# Patient Record
Sex: Female | Born: 1985 | Race: White | Hispanic: No | Marital: Married | Smoking: Never smoker
Health system: Southern US, Community
[De-identification: ages and names within clinical notes are randomized; demographics above are authoritative.]

## PROBLEM LIST (undated history)

## (undated) DIAGNOSIS — R112 Nausea with vomiting, unspecified: Secondary | ICD-10-CM

## (undated) DIAGNOSIS — Z9889 Other specified postprocedural states: Secondary | ICD-10-CM

## (undated) DIAGNOSIS — J45909 Unspecified asthma, uncomplicated: Secondary | ICD-10-CM

## (undated) HISTORY — PX: WRIST GANGLION EXCISION: SUR520

## (undated) HISTORY — PX: TONSILLECTOMY AND ADENOIDECTOMY: SHX28

---
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
## 2006-02-22 ENCOUNTER — Other Ambulatory Visit: Admission: RE | Admit: 2006-02-22 | Discharge: 2006-02-22 | Payer: Self-pay | Admitting: *Deleted

## 2007-10-17 ENCOUNTER — Other Ambulatory Visit: Admission: RE | Admit: 2007-10-17 | Discharge: 2007-10-17 | Payer: Self-pay | Admitting: Family Medicine

## 2007-11-17 ENCOUNTER — Encounter: Admission: RE | Admit: 2007-11-17 | Discharge: 2007-11-17 | Payer: Self-pay | Admitting: Orthopedic Surgery

## 2008-10-20 ENCOUNTER — Other Ambulatory Visit: Admission: RE | Admit: 2008-10-20 | Discharge: 2008-10-20 | Payer: Self-pay | Admitting: Family Medicine

## 2012-03-01 ENCOUNTER — Other Ambulatory Visit: Payer: Self-pay | Admitting: Obstetrics and Gynecology

## 2012-03-01 NOTE — Progress Notes (Signed)
Error

## 2013-10-28 LAB — OB RESULTS CONSOLE ANTIBODY SCREEN: Antibody Screen: NEGATIVE

## 2013-10-28 LAB — OB RESULTS CONSOLE HEPATITIS B SURFACE ANTIGEN: Hepatitis B Surface Ag: NEGATIVE

## 2013-10-28 LAB — OB RESULTS CONSOLE HIV ANTIBODY (ROUTINE TESTING): HIV: NONREACTIVE

## 2013-10-28 LAB — OB RESULTS CONSOLE ABO/RH: RH Type: POSITIVE

## 2013-10-28 LAB — OB RESULTS CONSOLE RPR: RPR: NONREACTIVE

## 2013-10-28 LAB — OB RESULTS CONSOLE RUBELLA ANTIBODY, IGM: Rubella: IMMUNE

## 2013-11-25 LAB — OB RESULTS CONSOLE GBS: STREP GROUP B AG: NEGATIVE

## 2013-12-02 LAB — OB RESULTS CONSOLE GC/CHLAMYDIA
Chlamydia: NEGATIVE
GC PROBE AMP, GENITAL: NEGATIVE

## 2014-03-11 ENCOUNTER — Inpatient Hospital Stay (HOSPITAL_COMMUNITY): Admission: AD | Admit: 2014-03-11 | Payer: Self-pay | Source: Ambulatory Visit | Admitting: Obstetrics & Gynecology

## 2014-06-06 ENCOUNTER — Inpatient Hospital Stay (HOSPITAL_COMMUNITY)
Admission: AD | Admit: 2014-06-06 | Discharge: 2014-06-07 | Disposition: A | Discharge status: Home / Self Care | Payer: BC Managed Care – PPO | Source: Ambulatory Visit | Attending: Obstetrics and Gynecology | Admitting: Obstetrics and Gynecology

## 2014-06-06 ENCOUNTER — Encounter (HOSPITAL_COMMUNITY): Payer: Self-pay

## 2014-06-06 DIAGNOSIS — O4693 Antepartum hemorrhage, unspecified, third trimester: Secondary | ICD-10-CM | POA: Diagnosis not present

## 2014-06-06 DIAGNOSIS — Z3A35 35 weeks gestation of pregnancy: Secondary | ICD-10-CM | POA: Diagnosis not present

## 2014-06-06 HISTORY — DX: Unspecified asthma, uncomplicated: J45.909

## 2014-06-06 NOTE — MAU Note (Signed)
Passed a quarter size blood clot at 9 pm. No further bleeding or clot after that episode. No leaking.  Baby moving well. At 9:30 pm having back pain, comes and goes.

## 2014-06-07 ENCOUNTER — Encounter (HOSPITAL_COMMUNITY): Payer: Self-pay | Admitting: Family

## 2014-06-07 DIAGNOSIS — O4693 Antepartum hemorrhage, unspecified, third trimester: Secondary | ICD-10-CM

## 2014-06-07 LAB — URINALYSIS, ROUTINE W REFLEX MICROSCOPIC
BILIRUBIN URINE: NEGATIVE
Glucose, UA: NEGATIVE mg/dL
HGB URINE DIPSTICK: NEGATIVE
KETONES UR: NEGATIVE mg/dL
Leukocytes, UA: NEGATIVE
NITRITE: NEGATIVE
PH: 5.5 (ref 5.0–8.0)
Protein, ur: NEGATIVE mg/dL
Urobilinogen, UA: 0.2 mg/dL (ref 0.0–1.0)

## 2014-06-07 NOTE — Discharge Instructions (Signed)
°  Vaginal Bleeding During Pregnancy, Third Trimester °A small amount of bleeding (spotting) from the vagina is relatively common in pregnancy. Various things can cause bleeding or spotting in pregnancy. Sometimes the bleeding is normal and is not a problem. However, bleeding during the third trimester can also be a sign of something serious for the mother and the baby. Be sure to tell your health care provider about any vaginal bleeding right away.  °Some possible causes of vaginal bleeding during the third trimester include:  °· The placenta may be partially or completely covering the opening to the cervix (placenta previa).   °· The placenta may have separated from the uterus (abruption of the placenta).   °· There may be an infection or growth on the cervix.   °· You may be starting labor, called discharging of the mucus plug.   °· The placenta may grow into the muscle layer of the uterus (placenta accreta).   °HOME CARE INSTRUCTIONS  °Watch your condition for any changes. The following actions may help to lessen any discomfort you are feeling:  °· Follow your health care provider's instructions for limiting your activity. If your health care provider orders bed rest, you may need to stay in bed and only get up to use the bathroom. However, your health care provider may allow you to continue light activity. °· If needed, make plans for someone to help with your regular activities and responsibilities while you are on bed rest. °· Keep track of the number of pads you use each day, how often you change pads, and how soaked (saturated) they are. Write this down. °· Do not use tampons. Do not douche. °· Do not have sexual intercourse or orgasms until approved by your health care provider. °· Follow your health care provider's advice about lifting, driving, and physical activities. °· If you pass any tissue from your vagina, save the tissue so you can show it to your health care provider.   °· Only take  over-the-counter or prescription medicines as directed by your health care provider. °· Do not take aspirin because it can make you bleed.   °· Keep all follow-up appointments as directed by your health care provider. °SEEK MEDICAL CARE IF: °· You have any vaginal bleeding during any part of your pregnancy. °· You have cramps or labor pains. °· You have a fever, not controlled by medicine. °SEEK IMMEDIATE MEDICAL CARE IF:  °· You have severe cramps or pain in your back or belly (abdomen). °· You have chills. °· You have a gush of fluid from the vagina. °· You pass large clots or tissue from your vagina. °· Your bleeding increases. °· You feel light-headed or weak. °· You pass out. °· You feel less movement or no movement of the baby.   °MAKE SURE YOU: °· Understand these instructions. °· Will watch your condition. °· Will get help right away if you are not doing well or get worse. °Document Released: 11/04/2002 Document Revised: 08/19/2013 Document Reviewed: 04/21/2013 °ExitCare® Patient Information ©2015 [employer]. This information is not intended to replace advice given to you by your health care provider. Make sure you discuss any questions you have with your health care provider. ° ° °

## 2014-06-07 NOTE — MAU Provider Note (Signed)
  History     CSN: [id number]  Arrival date and time: 06/06/14 2317   First Provider Initiated Contact with Patient 06/07/14 0000      Chief Complaint  Patient presents with  . Back Pain  . Vaginal Bleeding   Back Pain Pertinent negatives include no abdominal pain.  Vaginal Bleeding Associated symptoms include back pain. Pertinent negatives include no abdominal pain.    Pt is a 28 yo G1P0 at 6560w4d wks IUP here with report of a quarter sized clot of blood when wiping tonight x one.  Denies contractions, leaking of fluid, or active bleeding.  No problems during pregnancy.  No sexual intercourse in past 48 hours.    Past Medical History  Diagnosis Date  . Asthma     Past Surgical History  Procedure Laterality Date  . Tonsillectomy and adenoidectomy    . Wrist ganglion excision      left wrist    History reviewed. No pertinent family history.  History  Substance Use Topics  . Smoking status: Never Smoker   . Smokeless tobacco: Never Used  . Alcohol Use: No    Allergies: No Known Allergies  Prescriptions prior to admission  Medication Sig Dispense Refill  . Prenatal Vit-Fe Fumarate-FA (PRENATAL MULTIVITAMIN) TABS tablet Take 1 tablet by mouth daily at 12 noon.        Review of Systems  Gastrointestinal: Negative for abdominal pain.  Genitourinary: Positive for vaginal bleeding.       Clot of blood when wiping  Musculoskeletal: Positive for back pain.  All other systems reviewed and are negative.  Physical Exam   There were no vitals taken for this visit.  Physical Exam  Constitutional: She is oriented to person, place, and time. She appears well-developed and well-nourished.  HENT:  Head: Normocephalic.  Neck: Normal range of motion. Neck supple.  Cardiovascular: Normal rate, regular rhythm and normal heart sounds.   Respiratory: Effort normal and breath sounds normal.  GI: Soft. There is no tenderness.  Genitourinary: No bleeding around the vagina.  Vaginal discharge (mucusy) found.  Musculoskeletal: Normal range of motion.  Neurological: She is alert and oriented to person, place, and time.  Skin: Skin is warm and dry.   FHR 120's, +accels, reactive Toco - irritability  MAU Course  Procedures  0015 Consulted with Dr. Renaldo FiddlerAdkins > reviewed HPI/exam > discharge to home with preterm labor/bleeding precautions Assessment and Plan  28 yo G1P0 at 5460w4d wks IUP Category I FHR Tracing Vaginal Bleeding in Third Trimester - Likely Mucus Plug  Plan: Discharge to home Preterm labor/bleeding precautions Follow-up with OB as scheduled  KARIM, [facility] Center[facility]ALIDAH N 06/07/2014, 12:01 AM

## 2014-06-19 ENCOUNTER — Encounter (HOSPITAL_COMMUNITY): Payer: Self-pay | Admitting: *Deleted

## 2014-06-19 ENCOUNTER — Inpatient Hospital Stay (HOSPITAL_COMMUNITY)
Admission: AD | Admit: 2014-06-19 | Discharge: 2014-06-23 | DRG: 766 | Disposition: A | Discharge status: Home / Self Care | Payer: BC Managed Care – PPO | Source: Ambulatory Visit | Attending: Obstetrics & Gynecology | Admitting: Obstetrics & Gynecology

## 2014-06-19 DIAGNOSIS — O99214 Obesity complicating childbirth: Secondary | ICD-10-CM | POA: Diagnosis present

## 2014-06-19 DIAGNOSIS — Z3A37 37 weeks gestation of pregnancy: Secondary | ICD-10-CM | POA: Diagnosis present

## 2014-06-19 DIAGNOSIS — O4292 Full-term premature rupture of membranes, unspecified as to length of time between rupture and onset of labor: Secondary | ICD-10-CM | POA: Diagnosis present

## 2014-06-19 DIAGNOSIS — Z6836 Body mass index (BMI) 36.0-36.9, adult: Secondary | ICD-10-CM | POA: Diagnosis not present

## 2014-06-19 DIAGNOSIS — O4290 Premature rupture of membranes, unspecified as to length of time between rupture and onset of labor, unspecified weeks of gestation: Secondary | ICD-10-CM | POA: Diagnosis present

## 2014-06-19 DIAGNOSIS — Z98891 History of uterine scar from previous surgery: Secondary | ICD-10-CM

## 2014-06-19 LAB — COMPREHENSIVE METABOLIC PANEL
ALK PHOS: 226 U/L — AB (ref 39–117)
ALT: 24 U/L (ref 0–35)
ANION GAP: 13 (ref 5–15)
AST: 24 U/L (ref 0–37)
Albumin: 2.6 g/dL — ABNORMAL LOW (ref 3.5–5.2)
BILIRUBIN TOTAL: 0.2 mg/dL — AB (ref 0.3–1.2)
BUN: 8 mg/dL (ref 6–23)
CHLORIDE: 100 meq/L (ref 96–112)
CO2: 22 meq/L (ref 19–32)
CREATININE: 0.61 mg/dL (ref 0.50–1.10)
Calcium: 9.3 mg/dL (ref 8.4–10.5)
GLUCOSE: 81 mg/dL (ref 70–99)
POTASSIUM: 4 meq/L (ref 3.7–5.3)
Sodium: 135 mEq/L — ABNORMAL LOW (ref 137–147)
Total Protein: 7.2 g/dL (ref 6.0–8.3)

## 2014-06-19 LAB — TYPE AND SCREEN
ABO/RH(D): A POS
Antibody Screen: NEGATIVE

## 2014-06-19 LAB — ABO/RH: ABO/RH(D): A POS

## 2014-06-19 LAB — CBC
HEMATOCRIT: 32.7 % — AB (ref 36.0–46.0)
HEMOGLOBIN: 11.2 g/dL — AB (ref 12.0–15.0)
MCH: 29 pg (ref 26.0–34.0)
MCHC: 34.3 g/dL (ref 30.0–36.0)
MCV: 84.7 fL (ref 78.0–100.0)
PLATELETS: 257 10*3/uL (ref 150–400)
RBC: 3.86 MIL/uL — AB (ref 3.87–5.11)
RDW: 13.1 % (ref 11.5–15.5)
WBC: 13.6 10*3/uL — AB (ref 4.0–10.5)

## 2014-06-19 LAB — URIC ACID: Uric Acid, Serum: 6.1 mg/dL (ref 2.4–7.0)

## 2014-06-19 LAB — LACTATE DEHYDROGENASE: LDH: 179 U/L (ref 94–250)

## 2014-06-19 LAB — RPR

## 2014-06-19 LAB — POCT FERN TEST: POCT Fern Test: POSITIVE

## 2014-06-19 MED ORDER — LIDOCAINE HCL (PF) 1 % IJ SOLN: 30.0000 mL | INTRAMUSCULAR | Status: DC | PRN

## 2014-06-19 MED ORDER — TERBUTALINE SULFATE 1 MG/ML IJ SOLN: 0.2500 mg | Freq: Once | INTRAMUSCULAR | Status: AC | PRN

## 2014-06-19 MED ORDER — PHENYLEPHRINE 40 MCG/ML (10ML) SYRINGE FOR IV PUSH (FOR BLOOD PRESSURE SUPPORT)
80.0000 ug | PREFILLED_SYRINGE | INTRAVENOUS | Status: DC | PRN
  Filled 2014-06-19: qty 10
  Filled 2014-06-19: qty 2

## 2014-06-19 MED ORDER — LACTATED RINGERS IV SOLN
INTRAVENOUS | Status: DC
  Administered 2014-06-19 – 2014-06-20 (×7): via INTRAVENOUS

## 2014-06-19 MED ORDER — OXYTOCIN 40 UNITS IN LACTATED RINGERS INFUSION - SIMPLE MED: 62.5000 mL/h | INTRAVENOUS | Status: DC

## 2014-06-19 MED ORDER — CITRIC ACID-SODIUM CITRATE 334-500 MG/5ML PO SOLN
30.0000 mL | ORAL | Status: DC | PRN
Start: 2014-06-19 — End: 2014-06-20
  Administered 2014-06-20: 30 mL via ORAL
  Filled 2014-06-19: qty 15

## 2014-06-19 MED ORDER — ACETAMINOPHEN 325 MG PO TABS: 650.0000 mg | ORAL_TABLET | ORAL | Status: DC | PRN

## 2014-06-19 MED ORDER — EPHEDRINE 5 MG/ML INJ
10.0000 mg | INTRAVENOUS | Status: DC | PRN
  Filled 2014-06-19: qty 2

## 2014-06-19 MED ORDER — LACTATED RINGERS IV SOLN
500.0000 mL | INTRAVENOUS | Status: DC | PRN
  Administered 2014-06-20: 500 mL via INTRAVENOUS

## 2014-06-19 MED ORDER — OXYTOCIN BOLUS FROM INFUSION: 500.0000 mL | INTRAVENOUS | Status: DC

## 2014-06-19 MED ORDER — OXYCODONE-ACETAMINOPHEN 5-325 MG PO TABS: 1.0000 | ORAL_TABLET | ORAL | Status: DC | PRN

## 2014-06-19 MED ORDER — ONDANSETRON HCL 4 MG/2ML IJ SOLN: 4.0000 mg | Freq: Four times a day (QID) | INTRAMUSCULAR | Status: DC | PRN

## 2014-06-19 MED ORDER — FLEET ENEMA 7-19 GM/118ML RE ENEM: 1.0000 | ENEMA | RECTAL | Status: DC | PRN

## 2014-06-19 MED ORDER — LACTATED RINGERS IV SOLN
500.0000 mL | Freq: Once | INTRAVENOUS | Status: AC
  Administered 2014-06-20: 10:00:00 via INTRAVENOUS

## 2014-06-19 MED ORDER — DIPHENHYDRAMINE HCL 50 MG/ML IJ SOLN: 12.5000 mg | INTRAMUSCULAR | Status: DC | PRN

## 2014-06-19 MED ORDER — OXYTOCIN 40 UNITS IN LACTATED RINGERS INFUSION - SIMPLE MED
1.0000 m[IU]/min | INTRAVENOUS | Status: DC
  Filled 2014-06-19: qty 1000

## 2014-06-19 MED ORDER — PHENYLEPHRINE 40 MCG/ML (10ML) SYRINGE FOR IV PUSH (FOR BLOOD PRESSURE SUPPORT)
80.0000 ug | PREFILLED_SYRINGE | INTRAVENOUS | Status: DC | PRN
  Filled 2014-06-19: qty 2
  Filled 2014-06-19: qty 10

## 2014-06-19 MED ORDER — FENTANYL 2.5 MCG/ML BUPIVACAINE 1/10 % EPIDURAL INFUSION (WH - ANES)
14.0000 mL/h | INTRAMUSCULAR | Status: DC | PRN
  Administered 2014-06-20 (×2): 14 mL/h via EPIDURAL
  Filled 2014-06-19: qty 125

## 2014-06-19 MED ORDER — OXYCODONE-ACETAMINOPHEN 5-325 MG PO TABS: 2.0000 | ORAL_TABLET | ORAL | Status: DC | PRN

## 2014-06-19 NOTE — MAU Provider Note (Signed)
Betsy CoderJessica Long is a 28 y.o. G1P0 @ 5333w2d gestation who presents to the MAU with leaking of fluid.  BP 152/93  Pulse 75  Temp(Src) 97.9 F (36.6 C) (Oral)  Resp 18  Ht 5' 10.5" (1.791 m)  Wt 291 lb (131.997 kg)  BMI 41.15 kg/m2   Sterile spec exam done, pooling noted vaginal vault. Specimen obtained for evaluation for ferning.  Fern positive.  RN to contact Dr. Langston MaskerMorris with results.

## 2014-06-19 NOTE — H&P (Signed)
Betsy CoderJessica Kuan is a 28 y.o. female presenting for PROM at 1130 this AM.  She has had only mild back pain since then.  +FM and no VB.  Antepartum course has been uncomplicated.    Maternal Medical History:  Reason for admission: Rupture of membranes.   Contractions: Onset was 6-12 hours ago.   Frequency: rare.   Perceived severity is mild.    Fetal activity: Perceived fetal activity is normal.   Last perceived fetal movement was within the past hour.    Prenatal complications: no prenatal complications Prenatal Complications - Diabetes: none.    OB History   Grav Para Term Preterm Abortions TAB SAB Ect Mult Living   1              Past Medical History  Diagnosis Date  . Asthma    Past Surgical History  Procedure Laterality Date  . Tonsillectomy and adenoidectomy    . Wrist ganglion excision      left wrist   Family History: family history is not on file. Social History:  reports that she has never smoked. She has never used smokeless tobacco. She reports that she does not drink alcohol or use illicit drugs.   Prenatal Transfer Tool  Maternal Diabetes: No Genetic Screening: Normal Maternal Ultrasounds/Referrals: Normal Fetal Ultrasounds or other Referrals:  None Maternal Substance Abuse:  No Significant Maternal Medications:  None Significant Maternal Lab Results:  Lab values include: Group B Strep negative Other Comments:  None  ROS  Dilation: 1 Effacement (%): 80 Station: -2 Exam by:: K.Wilson,Rn Blood pressure 144/80, pulse 73, temperature 99 F (37.2 C), temperature source Oral, resp. rate 20, height 6' (1.829 m), weight 270 lb (122.471 kg). Maternal Exam:  Uterine Assessment: Contraction strength is mild.  Contraction frequency is rare.   Abdomen: Patient reports no abdominal tenderness. Fundal height is c/w dates.   Estimated fetal weight is 7#.       Physical Exam  Constitutional: She is oriented to person, place, and time. She appears  well-developed and well-nourished.  GI: Soft. There is no rebound and no guarding.  Neurological: She is alert and oriented to person, place, and time.  Skin: Skin is warm and dry.  Psychiatric: She has a normal mood and affect. Her behavior is normal.    Prenatal labs: ABO, Rh: --/--/A POS (10/23 1706) Antibody: PENDING (10/23 1706) Rubella: Immune (03/03 0000) RPR: Nonreactive (03/03 0000)  HBsAg: Negative (03/03 0000)  HIV: Non-reactive (03/03 0000)  GBS: Negative (03/31 0000)   Assessment/Plan: 27yo G1 at 8461w2d with PROM -Will add pitocin -Anticipate SVD -Epidural if desired   Rashell Shambaugh 06/19/2014, 6:21 PM

## 2014-06-19 NOTE — Progress Notes (Signed)
Discussed with Dr Langston MaskerMorris that GBS on face sheet of prenatals has no date and urine GBS was done 11/25/13 was negative.  Dr Langston MaskerMorris states she wouldn't treat pt for unknown GBS

## 2014-06-19 NOTE — MAU Note (Signed)
Around 1145, noted a small amt of clear watery fluid.   Continues to come.  No bleeding.  No cramping.

## 2014-06-20 ENCOUNTER — Inpatient Hospital Stay (HOSPITAL_COMMUNITY): Payer: BC Managed Care – PPO | Admitting: Anesthesiology

## 2014-06-20 ENCOUNTER — Encounter (HOSPITAL_COMMUNITY): Payer: BC Managed Care – PPO | Admitting: Anesthesiology

## 2014-06-20 ENCOUNTER — Encounter (HOSPITAL_COMMUNITY)
Admission: AD | Disposition: A | Discharge status: Home / Self Care | Payer: Self-pay | Source: Ambulatory Visit | Attending: Obstetrics & Gynecology

## 2014-06-20 ENCOUNTER — Encounter (HOSPITAL_COMMUNITY): Payer: Self-pay | Admitting: *Deleted

## 2014-06-20 DIAGNOSIS — Z98891 History of uterine scar from previous surgery: Secondary | ICD-10-CM

## 2014-06-20 LAB — CBC
HCT: 31 % — ABNORMAL LOW (ref 36.0–46.0)
HEMATOCRIT: 27.8 % — AB (ref 36.0–46.0)
Hemoglobin: 10.5 g/dL — ABNORMAL LOW (ref 12.0–15.0)
Hemoglobin: 9.6 g/dL — ABNORMAL LOW (ref 12.0–15.0)
MCH: 28.6 pg (ref 26.0–34.0)
MCH: 29.3 pg (ref 26.0–34.0)
MCHC: 33.9 g/dL (ref 30.0–36.0)
MCHC: 34.5 g/dL (ref 30.0–36.0)
MCV: 84.5 fL (ref 78.0–100.0)
MCV: 84.8 fL (ref 78.0–100.0)
PLATELETS: 241 10*3/uL (ref 150–400)
Platelets: 211 10*3/uL (ref 150–400)
RBC: 3.67 MIL/uL — ABNORMAL LOW (ref 3.87–5.11)
RBC: 3.28 MIL/uL — ABNORMAL LOW (ref 3.87–5.11)
RDW: 13 % (ref 11.5–15.5)
RDW: 13 % (ref 11.5–15.5)
WBC: 13.6 10*3/uL — ABNORMAL HIGH (ref 4.0–10.5)
WBC: 18.7 10*3/uL — ABNORMAL HIGH (ref 4.0–10.5)

## 2014-06-20 SURGERY — Surgical Case
Anesthesia: Epidural

## 2014-06-20 MED ORDER — ONDANSETRON HCL 4 MG/2ML IJ SOLN
INTRAMUSCULAR | Status: AC
  Filled 2014-06-20: qty 2

## 2014-06-20 MED ORDER — PROMETHAZINE HCL 25 MG/ML IJ SOLN
6.2500 mg | INTRAMUSCULAR | Status: DC | PRN
Start: 2014-06-20 — End: 2014-06-20

## 2014-06-20 MED ORDER — NALBUPHINE HCL 10 MG/ML IJ SOLN: 5.0000 mg | Freq: Once | INTRAMUSCULAR | Status: AC | PRN

## 2014-06-20 MED ORDER — SCOPOLAMINE 1 MG/3DAYS TD PT72
1.0000 | MEDICATED_PATCH | Freq: Once | TRANSDERMAL | Status: DC
  Administered 2014-06-20: 1.5 mg via TRANSDERMAL

## 2014-06-20 MED ORDER — MEPERIDINE HCL 25 MG/ML IJ SOLN: 6.2500 mg | INTRAMUSCULAR | Status: DC | PRN

## 2014-06-20 MED ORDER — TETANUS-DIPHTH-ACELL PERTUSSIS 5-2.5-18.5 LF-MCG/0.5 IM SUSP: 0.5000 mL | Freq: Once | INTRAMUSCULAR | Status: DC

## 2014-06-20 MED ORDER — OXYTOCIN 10 UNIT/ML IJ SOLN
INTRAMUSCULAR | Status: AC
Start: 2014-06-20 — End: 2014-06-20
  Filled 2014-06-20: qty 4

## 2014-06-20 MED ORDER — SIMETHICONE 80 MG PO CHEW: 80.0000 mg | CHEWABLE_TABLET | ORAL | Status: DC | PRN

## 2014-06-20 MED ORDER — MENTHOL 3 MG MT LOZG: 1.0000 | LOZENGE | OROMUCOSAL | Status: DC | PRN

## 2014-06-20 MED ORDER — SODIUM CHLORIDE 0.9 % IJ SOLN: 3.0000 mL | INTRAMUSCULAR | Status: DC | PRN

## 2014-06-20 MED ORDER — CEFAZOLIN SODIUM-DEXTROSE 2-3 GM-% IV SOLR: 2.0000 g | Freq: Three times a day (TID) | INTRAVENOUS | Status: DC

## 2014-06-20 MED ORDER — ONDANSETRON HCL 4 MG PO TABS
4.0000 mg | ORAL_TABLET | ORAL | Status: DC | PRN
Start: 2014-06-20 — End: 2014-06-23

## 2014-06-20 MED ORDER — SIMETHICONE 80 MG PO CHEW
80.0000 mg | CHEWABLE_TABLET | ORAL | Status: DC
  Administered 2014-06-21 – 2014-06-22 (×3): 80 mg via ORAL
  Filled 2014-06-20 (×3): qty 1

## 2014-06-20 MED ORDER — ZOLPIDEM TARTRATE 5 MG PO TABS: 5.0000 mg | ORAL_TABLET | Freq: Every evening | ORAL | Status: DC | PRN

## 2014-06-20 MED ORDER — DIPHENHYDRAMINE HCL 50 MG/ML IJ SOLN: 12.5000 mg | INTRAMUSCULAR | Status: DC | PRN

## 2014-06-20 MED ORDER — IBUPROFEN 600 MG PO TABS: 600.0000 mg | ORAL_TABLET | Freq: Four times a day (QID) | ORAL | Status: DC | PRN

## 2014-06-20 MED ORDER — MORPHINE SULFATE (PF) 0.5 MG/ML IJ SOLN
INTRAMUSCULAR | Status: DC | PRN
  Administered 2014-06-20: 3 mg via EPIDURAL
  Administered 2014-06-20: 2 mg via INTRAVENOUS

## 2014-06-20 MED ORDER — KETOROLAC TROMETHAMINE 30 MG/ML IJ SOLN: 15.0000 mg | Freq: Once | INTRAMUSCULAR | Status: DC | PRN

## 2014-06-20 MED ORDER — SCOPOLAMINE 1 MG/3DAYS TD PT72
MEDICATED_PATCH | TRANSDERMAL | Status: AC
  Filled 2014-06-20: qty 1

## 2014-06-20 MED ORDER — MIDAZOLAM HCL 2 MG/2ML IJ SOLN: 0.5000 mg | Freq: Once | INTRAMUSCULAR | Status: DC | PRN

## 2014-06-20 MED ORDER — IBUPROFEN 600 MG PO TABS
600.0000 mg | ORAL_TABLET | Freq: Four times a day (QID) | ORAL | Status: DC
  Administered 2014-06-21 – 2014-06-23 (×11): 600 mg via ORAL
  Filled 2014-06-20 (×11): qty 1

## 2014-06-20 MED ORDER — SENNOSIDES-DOCUSATE SODIUM 8.6-50 MG PO TABS
2.0000 | ORAL_TABLET | ORAL | Status: DC
  Administered 2014-06-21 – 2014-06-22 (×3): 2 via ORAL
  Filled 2014-06-20 (×3): qty 2

## 2014-06-20 MED ORDER — NALOXONE HCL 1 MG/ML IJ SOLN: 1.0000 ug/kg/h | INTRAVENOUS | Status: DC | PRN

## 2014-06-20 MED ORDER — NALOXONE HCL 0.4 MG/ML IJ SOLN: 0.4000 mg | INTRAMUSCULAR | Status: DC | PRN

## 2014-06-20 MED ORDER — KETOROLAC TROMETHAMINE 30 MG/ML IJ SOLN: 30.0000 mg | Freq: Four times a day (QID) | INTRAMUSCULAR | Status: DC | PRN

## 2014-06-20 MED ORDER — ACETAMINOPHEN 500 MG PO TABS
1000.0000 mg | ORAL_TABLET | Freq: Four times a day (QID) | ORAL | Status: AC
  Administered 2014-06-21 (×2): 1000 mg via ORAL
  Filled 2014-06-20 (×2): qty 2

## 2014-06-20 MED ORDER — FENTANYL CITRATE 0.05 MG/ML IJ SOLN
INTRAMUSCULAR | Status: AC
  Filled 2014-06-20: qty 2

## 2014-06-20 MED ORDER — DIPHENHYDRAMINE HCL 25 MG PO CAPS
25.0000 mg | ORAL_CAPSULE | ORAL | Status: DC | PRN
  Filled 2014-06-20: qty 1

## 2014-06-20 MED ORDER — OXYCODONE-ACETAMINOPHEN 5-325 MG PO TABS
2.0000 | ORAL_TABLET | ORAL | Status: DC | PRN
Start: 2014-06-20 — End: 2014-06-23

## 2014-06-20 MED ORDER — ONDANSETRON HCL 4 MG/2ML IJ SOLN: 4.0000 mg | INTRAMUSCULAR | Status: DC | PRN

## 2014-06-20 MED ORDER — LACTATED RINGERS IV SOLN: INTRAVENOUS | Status: DC

## 2014-06-20 MED ORDER — ONDANSETRON HCL 4 MG/2ML IJ SOLN
INTRAMUSCULAR | Status: DC | PRN
  Administered 2014-06-20: 4 mg via INTRAVENOUS

## 2014-06-20 MED ORDER — OXYTOCIN 40 UNITS IN LACTATED RINGERS INFUSION - SIMPLE MED
1.0000 m[IU]/min | INTRAVENOUS | Status: DC
Start: 2014-06-20 — End: 2014-06-20

## 2014-06-20 MED ORDER — LIDOCAINE HCL (PF) 1 % IJ SOLN
INTRAMUSCULAR | Status: DC | PRN
  Administered 2014-06-20 (×2): 5 mL

## 2014-06-20 MED ORDER — FENTANYL CITRATE 0.05 MG/ML IJ SOLN: 25.0000 ug | INTRAMUSCULAR | Status: DC | PRN

## 2014-06-20 MED ORDER — SODIUM BICARBONATE 8.4 % IV SOLN
INTRAVENOUS | Status: DC | PRN
  Administered 2014-06-20: 5 mL via EPIDURAL
  Administered 2014-06-20: 3 mL via EPIDURAL
  Administered 2014-06-20: 10 mL via EPIDURAL

## 2014-06-20 MED ORDER — KETOROLAC TROMETHAMINE 30 MG/ML IJ SOLN
30.0000 mg | Freq: Four times a day (QID) | INTRAMUSCULAR | Status: DC | PRN
  Administered 2014-06-20: 30 mg via INTRAMUSCULAR

## 2014-06-20 MED ORDER — NALBUPHINE HCL 10 MG/ML IJ SOLN: 5.0000 mg | INTRAMUSCULAR | Status: DC | PRN

## 2014-06-20 MED ORDER — 0.9 % SODIUM CHLORIDE (POUR BTL) OPTIME
TOPICAL | Status: DC | PRN
  Administered 2014-06-20: 500 mL

## 2014-06-20 MED ORDER — LANOLIN HYDROUS EX OINT: 1.0000 "application " | TOPICAL_OINTMENT | CUTANEOUS | Status: DC | PRN

## 2014-06-20 MED ORDER — MORPHINE SULFATE 0.5 MG/ML IJ SOLN
INTRAMUSCULAR | Status: AC
  Filled 2014-06-20: qty 10

## 2014-06-20 MED ORDER — PRENATAL MULTIVITAMIN CH
1.0000 | ORAL_TABLET | Freq: Every day | ORAL | Status: DC
  Administered 2014-06-21 – 2014-06-23 (×3): 1 via ORAL
  Filled 2014-06-20 (×3): qty 1

## 2014-06-20 MED ORDER — WITCH HAZEL-GLYCERIN EX PADS: 1.0000 "application " | MEDICATED_PAD | CUTANEOUS | Status: DC | PRN

## 2014-06-20 MED ORDER — DEXTROSE 5 % IV SOLN
3.0000 g | Freq: Once | INTRAVENOUS | Status: AC
  Administered 2014-06-20: 3 g via INTRAVENOUS
  Filled 2014-06-20: qty 3000

## 2014-06-20 MED ORDER — OXYTOCIN 40 UNITS IN LACTATED RINGERS INFUSION - SIMPLE MED: 62.5000 mL/h | INTRAVENOUS | Status: AC

## 2014-06-20 MED ORDER — OXYTOCIN 10 UNIT/ML IJ SOLN
40.0000 [IU] | INTRAVENOUS | Status: DC | PRN
  Administered 2014-06-20: 40 [IU] via INTRAVENOUS

## 2014-06-20 MED ORDER — DIBUCAINE 1 % RE OINT: 1.0000 "application " | TOPICAL_OINTMENT | RECTAL | Status: DC | PRN

## 2014-06-20 MED ORDER — FENTANYL CITRATE 0.05 MG/ML IJ SOLN
INTRAMUSCULAR | Status: DC | PRN
  Administered 2014-06-20: 100 ug via INTRAVENOUS

## 2014-06-20 MED ORDER — ONDANSETRON HCL 4 MG/2ML IJ SOLN: 4.0000 mg | Freq: Three times a day (TID) | INTRAMUSCULAR | Status: DC | PRN

## 2014-06-20 MED ORDER — OXYCODONE-ACETAMINOPHEN 5-325 MG PO TABS
1.0000 | ORAL_TABLET | ORAL | Status: DC | PRN
  Administered 2014-06-21 – 2014-06-22 (×2): 1 via ORAL
  Filled 2014-06-20 (×2): qty 1

## 2014-06-20 MED ORDER — KETOROLAC TROMETHAMINE 30 MG/ML IJ SOLN
INTRAMUSCULAR | Status: AC
  Filled 2014-06-20: qty 1

## 2014-06-20 MED ORDER — DIPHENHYDRAMINE HCL 25 MG PO CAPS: 25.0000 mg | ORAL_CAPSULE | Freq: Four times a day (QID) | ORAL | Status: DC | PRN

## 2014-06-20 MED ORDER — SIMETHICONE 80 MG PO CHEW
80.0000 mg | CHEWABLE_TABLET | Freq: Three times a day (TID) | ORAL | Status: DC
  Administered 2014-06-21 – 2014-06-23 (×7): 80 mg via ORAL
  Filled 2014-06-20 (×6): qty 1

## 2014-06-20 SURGICAL SUPPLY — 37 items
ADH SKN CLS APL DERMABOND .7 (GAUZE/BANDAGES/DRESSINGS)
APL SKNCLS STERI-STRIP NONHPOA (GAUZE/BANDAGES/DRESSINGS) ×1
BENZOIN TINCTURE PRP APPL 2/3 (GAUZE/BANDAGES/DRESSINGS) ×3 IMPLANT
CLAMP CORD UMBIL (MISCELLANEOUS) IMPLANT
CLOSURE WOUND 1/2 X4 (GAUZE/BANDAGES/DRESSINGS) ×1
CLOTH BEACON ORANGE TIMEOUT ST (SAFETY) ×3 IMPLANT
DERMABOND ADVANCED (GAUZE/BANDAGES/DRESSINGS)
DERMABOND ADVANCED .7 DNX12 (GAUZE/BANDAGES/DRESSINGS) IMPLANT
DRAPE SHEET LG 3/4 BI-LAMINATE (DRAPES) IMPLANT
DRSG OPSITE POSTOP 4X10 (GAUZE/BANDAGES/DRESSINGS) ×3 IMPLANT
DURAPREP 26ML APPLICATOR (WOUND CARE) ×3 IMPLANT
ELECT REM PT RETURN 9FT ADLT (ELECTROSURGICAL) ×3
ELECTRODE REM PT RTRN 9FT ADLT (ELECTROSURGICAL) ×1 IMPLANT
EXTRACTOR VACUUM KIWI (MISCELLANEOUS) ×6 IMPLANT
EXTRACTOR VACUUM M CUP 4 TUBE (SUCTIONS) IMPLANT
EXTRACTOR VACUUM M CUP 4' TUBE (SUCTIONS)
GLOVE BIO SURGEON STRL SZ 6 (GLOVE) ×3 IMPLANT
GLOVE BIOGEL PI IND STRL 6 (GLOVE) ×2 IMPLANT
GLOVE BIOGEL PI INDICATOR 6 (GLOVE) ×4
GOWN STRL REUS W/TWL LRG LVL3 (GOWN DISPOSABLE) ×6 IMPLANT
KIT ABG SYR 3ML LUER SLIP (SYRINGE) ×3 IMPLANT
NEEDLE HYPO 25X5/8 SAFETYGLIDE (NEEDLE) ×3 IMPLANT
NS IRRIG 1000ML POUR BTL (IV SOLUTION) ×3 IMPLANT
PACK C SECTION WH (CUSTOM PROCEDURE TRAY) ×3 IMPLANT
PAD OB MATERNITY 4.3X12.25 (PERSONAL CARE ITEMS) ×3 IMPLANT
STAPLER VISISTAT 35W (STAPLE) IMPLANT
STRIP CLOSURE SKIN 1/2X4 (GAUZE/BANDAGES/DRESSINGS) ×2 IMPLANT
SUT CHROMIC 0 CTX 36 (SUTURE) ×9 IMPLANT
SUT MON AB 2-0 CT1 27 (SUTURE) ×3 IMPLANT
SUT PDS AB 0 CT1 27 (SUTURE) IMPLANT
SUT PLAIN 0 NONE (SUTURE) IMPLANT
SUT PROLENE 2 0 CT 30 (SUTURE) ×3 IMPLANT
SUT VIC AB 0 CT1 36 (SUTURE) ×3 IMPLANT
SUT VIC AB 4-0 KS 27 (SUTURE) ×3 IMPLANT
TOWEL OR 17X24 6PK STRL BLUE (TOWEL DISPOSABLE) ×3 IMPLANT
TRAY FOLEY CATH 14FR (SET/KITS/TRAYS/PACK) IMPLANT
WATER STERILE IRR 1000ML POUR (IV SOLUTION) ×3 IMPLANT

## 2014-06-20 NOTE — Transfer of Care (Signed)
Immediate Anesthesia Transfer of Care Note  Patient: Betsy CoderJessica Koury  Procedure(s) Performed: Procedure(s): CESAREAN SECTION (N/A)  Patient Location: PACU  Anesthesia Type:Epidural  Level of Consciousness: awake and alert   Airway & Oxygen Therapy: Patient Spontanous Breathing  Post-op Assessment: Report given to PACU RN and Post -op Vital signs reviewed and stable  Post vital signs: Reviewed and stable  Complications: No apparent anesthesia complications

## 2014-06-20 NOTE — Anesthesia Postprocedure Evaluation (Signed)
  Anesthesia Post Note  Patient: [name]  Procedure(s) Performed: Procedure(s) (LRB): CESAREAN SECTION (N/A)  Anesthesia type: Epidural  Patient location: PACU  Post pain: Pain level controlled  Post assessment: Post-op Vital signs reviewed  Last Vitals:  Filed Vitals:   06/20/14 1540  BP: 120/103  Pulse: 84  Temp:   Resp:     Post vital signs: Reviewed  Level of consciousness: awake  Complications: No apparent anesthesia complications

## 2014-06-20 NOTE — Op Note (Signed)
Doris CoderJessica Sluder PROCEDURE DATE: 06/19/2014 - 06/20/2014  PREOPERATIVE DIAGNOSIS: Intrauterine pregnancy at  5662w3d weeks gestation, with non-reassuring fetal tracing  POSTOPERATIVE DIAGNOSIS: The same  PROCEDURE:   Primary Low Transverse Cesarean Section  SURGEON:  Dr. Mitchel HonourMegan Xitlalic Maslin  INDICATIONS: Doris CoderJessica Long is a 28 y.o. G1P1001 at 262w3d scheduled for cesarean section secondary to non-reassuring fetal heart tracing.  The risks of cesarean section discussed with the patient included but were not limited to: bleeding which may require transfusion or reoperation; infection which may require antibiotics; injury to bowel, bladder, ureters or other surrounding organs; injury to the fetus; need for additional procedures including hysterectomy in the event of a life-threatening hemorrhage; placental abnormalities wth subsequent pregnancies, incisional problems, thromboembolic phenomenon and other postoperative/anesthesia complications. The patient concurred with the proposed plan, giving informed written consent for the procedure.    ,FINDINGS:  Viable female infant in cephalic presentation, APGARs 7,9:  Weight 8#0  Clear amniotic fluid.  Intact placenta, three vessel cord.  Grossly normal uterus, ovaries and fallopian tubes. .   ANESTHESIA:    Epidural ESTIMATED BLOOD LOSS: 800 ml SPECIMENS: Placenta sent to L&D COMPLICATIONS: None immediate  PROCEDURE IN DETAIL:  The patient received intravenous antibiotics and had sequential compression devices applied to her lower extremities while in the preoperative area.  She was then taken to the operating room where epidural anesthesia was dosed up to surgical level and was found to be adequate. She was then placed in a dorsal supine position with a leftward tilt, and prepped and draped in a sterile manner.  A foley catheter was placed into her bladder and attached to constant gravity.  After an adequate timeout was performed, a Pfannenstiel skin incision was  made with scalpel and carried through to the underlying layer of fascia. The fascia was incised in the midline and this incision was extended bilaterally using the Mayo scissors. Kocher clamps were applied to the superior aspect of the fascial incision and the underlying rectus muscles were dissected off bluntly. A similar process was carried out on the inferior aspect of the facial incision. The rectus muscles were separated in the midline bluntly and the peritoneum was entered bluntly.   A bladder flap was created sharply and developed bluntly.  The bladder blade was placed.  A transverse hysterotomy was made with a scalpel and extended bilaterally bluntly. The bladder blade was then removed. Secondary to tight rectus muscles restricting delivery of the fetal vertex, the rectus muscles were cut to allow room for delivery.  Dr. Debroah LoopArnold was called to assist with delivery and using a Kiwi vacuum, the infant was successfully delivered.  Cord was clamped and cut and infant was handed over to awaiting neonatology team. Uterine massage was then administered and the placenta delivered intact with three-vessel cord. The uterus was cleared of clot and debris.  The hysterotomy was closed with 0 chromic.  A second imbricating suture of 0-chromic was used to reinforce the incision and aid in hemostasis.  The peritoneum and rectus muscles were noted to be hemostatic and were reapproximated using 2-0 monocryl in a running fashion.  The fascia was closed with 0-Vicryl in a running fashion with good restoration of anatomy.  The subcutaneus tissue was copiously irrigated and reapproximated using plain gut.  The skin was closed with 4-0 Vicryl in a subcuticular fashion.  Pt tolerated the procedure will.  All counts were correct x2.  Pt went to the recovery room in stable condition.

## 2014-06-20 NOTE — Progress Notes (Signed)
Betsy CoderJessica Addair is a 28 y.o. G1P0 at 8239w3d by ultrasound admitted for PROM  Subjective: No current c/o.  Feeling mild CTX.    Objective: BP 106/93  Pulse 63  Temp(Src) 97.8 F (36.6 C) (Oral)  Resp 20  Ht 6' (1.829 m)  Wt 270 lb (122.471 kg)  BMI 36.61 kg/m2  SpO2 98%      FHT:  FHR: 140 bpm, variability: moderate,  accelerations:  Present,  decelerations:  Absent UC:   regular, every 2-3 minutes, pit 20 SVE:   Dilation: 2 Effacement (%): 60 Station: -2 Exam by:: dr Langston Maskermorris AROM of forebag  Labs: Lab Results  Component Value Date   WBC 13.6* 06/19/2014   HGB 11.2* 06/19/2014   HCT 32.7* 06/19/2014   MCV 84.7 06/19/2014   PLT 257 06/19/2014    Assessment / Plan: Augmentation of labor, progressing well  Labor: Progressing normally Preeclampsia:  n/a Fetal Wellbeing:  Category I Pain Control:  Labor support without medications I/D:  n/a Anticipated MOD:  NSVD  Marriah Sanderlin 06/20/2014, 8:42 AM

## 2014-06-20 NOTE — Anesthesia Procedure Notes (Signed)
Epidural Patient location during procedure: OB Start time: 06/20/2014 10:05 AM  Staffing Anesthesiologist: Brayton CavesJACKSON, Saturnino Liew Performed by: anesthesiologist   Preanesthetic Checklist Completed: patient identified, site marked, surgical consent, pre-op evaluation, timeout performed, IV checked, risks and benefits discussed and monitors and equipment checked  Epidural Patient position: sitting Prep: site prepped and draped and DuraPrep Patient monitoring: continuous pulse ox and blood pressure Approach: midline Location: L3-L4 Injection technique: LOR air  Needle:  Needle type: Tuohy  Needle gauge: 17 G Needle length: 9 cm and 9 Needle insertion depth: 8 cm Catheter type: closed end flexible Catheter size: 19 Gauge Catheter at skin depth: 13 cm Test dose: negative  Assessment Events: blood not aspirated, injection not painful, no injection resistance, negative IV test and no paresthesia  Additional Notes Patient identified.  Risk benefits discussed including failed block, incomplete pain control, headache, nerve damage, paralysis, blood pressure changes, nausea, vomiting, reactions to medication both toxic or allergic, and postpartum back pain.  Patient expressed understanding and wished to proceed.  All questions were answered.  Sterile technique used throughout procedure and epidural site dressed with sterile barrier dressing. No paresthesia or other complications noted.The patient did not experience any signs of intravascular injection such as tinnitus or metallic taste in mouth nor signs of intrathecal spread such as rapid motor block. Please see nursing notes for vital signs.

## 2014-06-20 NOTE — Progress Notes (Signed)
Patient has been ruptured >24 hours.  She has been 3-4 cm x 4 hours.  MVUs are inadequate however with the majority of CTXs, there is a late decel despite repositioning.  Pitocin has been turned off and tracing returned to category I.  The patient is counseled for primary C/S secondary to NRFHT.  She understands the risk of bleeding, infection, scarring and damage to surrounding structures.  She also understands the implications in future pregnancies.  All questions were answered and the patient wishes to proceed.    Mitchel HonourMegan Benelli Winther, DO

## 2014-06-20 NOTE — Anesthesia Preprocedure Evaluation (Signed)
Anesthesia Evaluation  Patient identified by MRN, date of birth, ID band Patient awake    Reviewed: Allergy & Precautions, H&P , Patient's Chart, lab work & pertinent test results  Airway Mallampati: III TM Distance: >3 FB Neck ROM: full    Dental   Pulmonary asthma ,  breath sounds clear to auscultation        Cardiovascular hypertension, Rhythm:regular Rate:Normal     Neuro/Psych    GI/Hepatic   Endo/Other  Morbid obesity  Renal/GU      Musculoskeletal   Abdominal   Peds  Hematology   Anesthesia Other Findings   Reproductive/Obstetrics (+) Pregnancy                           Anesthesia Physical Anesthesia Plan  ASA: III  Anesthesia Plan: Epidural   Post-op Pain Management:    Induction:   Airway Management Planned:   Additional Equipment:   Intra-op Plan:   Post-operative Plan:   Informed Consent: I have reviewed the patients History and Physical, chart, labs and discussed the procedure including the risks, benefits and alternatives for the proposed anesthesia with the patient or authorized representative who has indicated his/her understanding and acceptance.     Plan Discussed with:   Anesthesia Plan Comments:         Anesthesia Quick Evaluation

## 2014-06-20 NOTE — Progress Notes (Signed)
Lab at bs for cbc for epidural.

## 2014-06-21 LAB — CBC
HCT: 25.2 % — ABNORMAL LOW (ref 36.0–46.0)
Hemoglobin: 8.6 g/dL — ABNORMAL LOW (ref 12.0–15.0)
MCH: 29.2 pg (ref 26.0–34.0)
MCHC: 34.1 g/dL (ref 30.0–36.0)
MCV: 85.4 fL (ref 78.0–100.0)
PLATELETS: 183 10*3/uL (ref 150–400)
RBC: 2.95 MIL/uL — ABNORMAL LOW (ref 3.87–5.11)
RDW: 13.4 % (ref 11.5–15.5)
WBC: 12.6 10*3/uL — ABNORMAL HIGH (ref 4.0–10.5)

## 2014-06-21 MED ORDER — FERROUS SULFATE 325 (65 FE) MG PO TABS
325.0000 mg | ORAL_TABLET | Freq: Two times a day (BID) | ORAL | Status: DC
  Administered 2014-06-21 – 2014-06-23 (×4): 325 mg via ORAL
  Filled 2014-06-21 (×4): qty 1

## 2014-06-21 NOTE — Progress Notes (Signed)
Subjective: Postpartum Day 1: Cesarean Delivery Patient reports tolerating PO and no problems voiding.    Objective: Vital signs in last 24 hours: Temp:  [97.7 F (36.5 C)-98.7 F (37.1 C)] 98.4 F (36.9 C) (10/25 1300) Pulse Rate:  [66-90] 82 (10/25 1300) Resp:  [16-25] 20 (10/25 0801) BP: (119-151)/(62-103) 137/62 mmHg (10/25 1300) SpO2:  [94 %-100 %] 98 % (10/25 1300)  Physical Exam:  General: alert, cooperative and appears stated age 8Lochia: appropriate Uterine Fundus: firm Incision: healing well, no significant drainage, no dehiscence DVT Evaluation: No evidence of DVT seen on physical exam. Negative Homan's sign. No cords or calf tenderness.   Recent Labs  06/20/14 1745 06/21/14 0605  HGB 9.6* 8.6*  HCT 27.8* 25.2*    Assessment/Plan: Status post Cesarean section. Doing well postoperatively.  Continue current care.  [name] 06/21/2014, 1:48 PM

## 2014-06-21 NOTE — Anesthesia Postprocedure Evaluation (Signed)
  Anesthesia Post-op Note  Patient: Doris CoderJessica Sponsel  Procedure(s) Performed: Procedure(s): CESAREAN SECTION (N/A)  Patient Location: Mother/Baby  Anesthesia Type:Epidural  Level of Consciousness: awake and alert   Airway and Oxygen Therapy: Patient Spontanous Breathing  Post-op Pain: mild  Post-op Assessment: Post-op Vital signs reviewed, Patient's Cardiovascular Status Stable, Respiratory Function Stable, No signs of Nausea or vomiting, Pain level controlled, No headache, No residual numbness and No residual motor weakness  Post-op Vital Signs: Reviewed  Last Vitals:  Filed Vitals:   06/21/14 0801  BP: 119/62  Pulse: 72  Temp: 36.9 C  Resp: 20    Complications: No apparent anesthesia complications

## 2014-06-21 NOTE — Lactation Note (Signed)
This note was copied from the chart of [name]. Lactation Consultation Note  Initial visit done.  Breastfeeding consultation services and support given to patient.  Mom states baby is latching with nipple shield and she feels pulls and tugs.  Mom just attempted feeding but baby sleepy and not interested.  Discussed cluster feedings.  Encouraged to watch for feeding cues and call with concerns/assist prn  Patient Name: Doris Betsy CoderJessica Rudnicki WNUUV'OToday's Date: 06/21/2014 Reason for consult: Initial assessment   Maternal Data    Feeding Feeding Type: Breast Fed Length of feed: 20 min  LATCH Score/Interventions                      Lactation Tools Discussed/Used     Consult Status Consult Status: Follow-up Date: 06/22/14 Follow-up type: In-patient    Huston FoleyMOULDEN, Zlatan Hornback S 06/21/2014, 11:58 AM

## 2014-06-22 ENCOUNTER — Encounter (HOSPITAL_COMMUNITY): Payer: Self-pay | Admitting: Obstetrics & Gynecology

## 2014-06-22 NOTE — Lactation Note (Signed)
This note was copied from the chart of Girl Betsy CoderJessica Schaab. Lactation Consultation Note  Patient Name: Girl Betsy CoderJessica Lafuente ZOXWR'UToday's Date: 06/22/2014 Reason for consult: Follow-up assessment;Difficult latch Mom was latching baby without the nipple shield reporting she has not used the nipple shield since yesterday. Baby at this visit was not obtaining good depth, dimpling noted with suckling, baby did not have wide gape with well flanged lips. Worked with positioning with Mom but this did not improve depth with latch. Baby was sleepy and would not sustain a good suckling pattern. LC initiated #20 nipple shield and baby was able to obtain more depth, demonstrated a better suckling pattern, bottom lip still needs adjustment off/on. Mom reports some breast milk in the nipple shield when baby came off the breast. LC encouraged Mom to use nipple shield with latch at this point. Set up DEBP for Mom to start post pumping (at least 4 times/day) to encourage milk production. If she receives any EBM advised to finger, spoon or cup feed this back to baby. Baby I/O is adequate at this time, weight loss at 4% at 44 hours so feel baby has been getting some EBM, however nipple shield does appear to help baby sustain a good suckling pattern. Advised Mom baby should be at the breast 8-12 times in 24 hours. Call for assist as needed with latch.   Maternal Data    Feeding Feeding Type: Breast Fed Length of feed: 20 min  LATCH Score/Interventions Latch: Repeated attempts needed to sustain latch, nipple held in mouth throughout feeding, stimulation needed to elicit sucking reflex. (initiated #20 nipple shield) Intervention(s): Adjust position;Assist with latch;Breast massage;Breast compression  Audible Swallowing: A few with stimulation  Type of Nipple: Flat  Comfort (Breast/Nipple): Soft / non-tender     Hold (Positioning): Assistance needed to correctly position infant at breast and maintain  latch. Intervention(s): Breastfeeding basics reviewed;Support Pillows  LATCH Score: 6  Lactation Tools Discussed/Used Tools: Nipple Dorris CarnesShields;Pump Nipple shield size: 20;24 Breast pump type: Double-Electric Breast Pump Pump Review: Setup, frequency, and cleaning Initiated by:: KG Date initiated:: 06/22/14   Consult Status Consult Status: Follow-up Date: 06/22/14 Follow-up type: In-patient    Alfred LevinsGranger, Arjuna Doeden Ann 06/22/2014, 1:00 PM

## 2014-06-22 NOTE — Progress Notes (Signed)
Subjective: Postpartum Day 2: Cesarean Delivery Patient reports tolerating PO and no problems voiding.    Objective: Vital signs in last 24 hours: Temp:  [97.7 F (36.5 C)-98.4 F (36.9 C)] 98 F (36.7 C) (10/26 0542) Pulse Rate:  [69-82] 69 (10/26 0542) Resp:  [18-20] 20 (10/26 0542) BP: (134-154)/(54-88) 134/57 mmHg (10/26 0542) SpO2:  [98 %-100 %] 100 % (10/25 1805)  Physical Exam:  General: alert and cooperative Lochia: appropriate Uterine Fundus: firm Incision: honeycomb dressing CDI DVT Evaluation: No evidence of DVT seen on physical exam. Negative Homan's sign. No cords or calf tenderness. No significant calf/ankle edema.   Recent Labs  06/20/14 1745 06/21/14 0605  HGB 9.6* 8.6*  HCT 27.8* 25.2*    Assessment/Plan: Status post Cesarean section. Doing well postoperatively.  Continue current care.  Peni Rupard G 06/22/2014, 8:41 AM

## 2014-06-22 NOTE — Lactation Note (Signed)
This note was copied from the chart of Girl Betsy CoderJessica Pearce. Lactation Consultation Note Wanted LC to verify latch. Mom sitting in recliner BF in football hold using NS. Baby has been BF fine and has fallen a sleep. Needs to keep baby cheek to breast. Noted nipple tissue visual. Explained to keep close latch to prevent nipple soreness and baby slipping off breast. Baby was fully dressed and warm. Encouraged STS during feedings, and use stimulation at intervals to encourage adequate feedings. Mom states comfortable during feeding when baby close to breast nursing.  Patient Name: Girl Betsy CoderJessica Porco RUEAV'WToday's Date: 06/22/2014 Reason for consult: Follow-up assessment   Maternal Data    Feeding Feeding Type: Breast Fed Length of feed: 15 min  LATCH Score/Interventions Latch: Repeated attempts needed to sustain latch, nipple held in mouth throughout feeding, stimulation needed to elicit sucking reflex. Intervention(s): Adjust position;Breast massage  Audible Swallowing: A few with stimulation Intervention(s): Hand expression  Type of Nipple: Flat  Comfort (Breast/Nipple): Soft / non-tender     Hold (Positioning): Assistance needed to correctly position infant at breast and maintain latch. Intervention(s): Support Pillows;Position options  LATCH Score: 6  Lactation Tools Discussed/Used Tools: Nipple Dorris CarnesShields;Pump Nipple shield size: 20 Breast pump type: Double-Electric Breast Pump Pump Review: Setup, frequency, and cleaning Initiated by:: KG Date initiated:: 06/22/14   Consult Status Consult Status: Follow-up Date: 06/23/14 Follow-up type: In-patient    Charyl DancerCARVER, Vernor Monnig G 06/22/2014, 4:06 PM

## 2014-06-23 ENCOUNTER — Ambulatory Visit: Payer: Self-pay

## 2014-06-23 MED ORDER — OXYCODONE-ACETAMINOPHEN 7.5-325 MG PO TABS: 1.0000 | ORAL_TABLET | ORAL | Status: DC | PRN

## 2014-06-23 NOTE — Discharge Summary (Signed)
Obstetric Discharge Summary Reason for Admission: rupture of membranes Prenatal Procedures: none Intrapartum Procedures: cesarean: low cervical, transverse Postpartum Procedures: none Complications-Operative and Postpartum: none Hemoglobin  Date Value Ref Range Status  06/21/2014 8.6* 12.0 - 15.0 g/dL Final     HCT  Date Value Ref Range Status  06/21/2014 25.2* 36.0 - 46.0 % Final    Physical Exam:  General: alert Lochia: appropriate Uterine Fundus: firm Incision: healing well DVT Evaluation: No evidence of DVT seen on physical exam.  Discharge Diagnoses: Term Pregnancy-delivered  Discharge Information: Date: 06/23/2014 Activity: pelvic rest Diet: routine Medications: PNV and Percocet Condition: stable Instructions: refer to practice specific booklet Discharge to: home   Newborn Data: Live born female  Birth Weight: 8 lb (3629 g) APGAR: 7, 9  Home with mother.  [name] S 06/23/2014, 8:29 AM

## 2014-06-23 NOTE — Lactation Note (Signed)
This note was copied from the chart of Girl Betsy CoderJessica Batt. Lactation Consultation Note  Patient Name: Girl Betsy CoderJessica Armenteros WUJWJ'XToday's Date: 06/23/2014  Mom reports baby is nursing well using nipple shield, Mom reports observing breast milk in the nipple shield when baby comes off the breast and baby has been more satisfied per parents report. Mom is post pumping receiving 7 ml this am with last pumping. Breasts are feeling heavier per Mom today. Changed nipple shield to size 16 for better fit. Demonstrated to parents how to pre-fill the nipple shield with EBM to help with latch if baby fussy at the breast. Demonstrated to parents how to give baby back EBM Mom has pumped by finger feeding with curved tipped syringe. Mom denies discomfort with baby at the breast and the nipple shield was full when baby came off the breast. Plan at this time is Mom will BF with ques but at least 8-12 times in 24 hours. She will post pump 4 times per day and give baby back any amount of EBM she receives. Engorgement care reviewed if needed. OP f/u with Lactation scheduled for Friday, 06/26/14 at 1:00 pm. Peds f/u Thursday for weight check.    Maternal Data    Feeding Feeding Type: Breast Fed Length of feed: 30 min  LATCH Score/Interventions                      Lactation Tools Discussed/Used Tools: Nipple Shields Nipple shield size: 16   Consult Status      Alfred LevinsGranger, Deyjah Kindel Ann 06/23/2014, 2:35 PM

## 2014-06-26 ENCOUNTER — Ambulatory Visit (HOSPITAL_COMMUNITY): Payer: BC Managed Care – PPO

## 2014-06-26 ENCOUNTER — Ambulatory Visit (HOSPITAL_COMMUNITY)
Admission: RE | Admit: 2014-06-26 | Discharge: 2014-06-26 | Disposition: A | Discharge status: Home / Self Care | Payer: BC Managed Care – PPO | Source: Ambulatory Visit | Attending: Obstetrics & Gynecology | Admitting: Obstetrics & Gynecology

## 2014-06-26 NOTE — Lactation Note (Signed)
Lactation Consult Baby's Name: Jewel Baizella Defrank  Date of Birth: 06/20/2013  Gender: female  Gestational Age: 8536w3d (At Birth)  Birth Weight: 8 lb (3629 g)  Weight at Discharge: Weight: 7 lb 4.6 oz (3305 g) Date of Discharge: 06/23/2014  Filed Weights   06/21/14 0136 06/22/14 0042 06/23/14 0021  Weight: 7 lb 14.8 oz (3595 g) 7 lb 10.8 oz (3480 g) 7 lb 4.6 oz (3305 g)      Mother's reason for visit:  Was using a NS while here is hospitakl Visit Type:  Feeding assessment \ Consult:  Initial Lactation Consultant:  Audry RilesWeeks, Caytlyn Evers D  ________________________________________________________________________    ________________________________________________________________________  Mother's Name: Betsy CoderJessica Amedee Type of delivery:   Breastfeeding Experience:  p1    ________________________________________________________________________  Breastfeeding History (Post Discharge)  Frequency of breastfeeding:  q 2-3 Duration of feeding:  30-45  Supplementation  Breastmilk:  Has not supplemented in last 24 hours  Method:  Syringe,   Pumping  Type of pump:  Symphony Frequency:  1 time/day Volume:  30-45 ml  Infant Intake and Output Assessment  Voids:  8-10 in 24 hrs.  Color:  Clear yellow Stools:  8-10 in 24 hrs.  Color:  Brown and Yellow  ________________________________________________________________________  Maternal Breast Assessment  Breast:  Filling Nipple:  Erect Pain level:  0 Pain interventions:  None  _______________________________________________________________________ Feeding Assessment/Evaluation  Initial feeding assessment:  Infant's oral assessment:  WNL  Positioning:  Football Left breast  LATCH documentation:  Latch:  2 = Grasps breast easily, tongue down, lips flanged, rhythmical sucking.  Audible swallowing:  2 = Spontaneous and intermittent  Type of nipple:  2 = Everted at rest and after stimulation  Comfort (Breast/Nipple):  2 = Soft /  non-tender  Hold (Positioning):  1 = Assistance needed to correctly position infant at breast and maintain latch  LATCH score:  9  Attached assessment:  Deep  Lips flanged:  Yes.    Lips untucked:  No.  Suck assessment:  Nutritive    Pre-feed weight: 3478 g  (7 lb. 10.7 oz.) Post-feed weight:  3522 g (7 lb. 12.2 oz.) Amount transferred: 44 ml Amount supplemented:  0 ml  Additional Feeding Assessment -   Infant's oral assessment:  WNL  Positioning:  Football Right breast  LATCH documentation:  Latch:  2 = Grasps breast easily, tongue down, lips flanged, rhythmical sucking.  Audible swallowing:  1  Type of nipple:  2 = Everted at rest and after stimulation  Comfort (Breast/Nipple):  2 = Soft / non-tender  Hold (Positioning):  1 = Assistance needed to correctly position infant at breast and maintain latch  LATCH score:  8  Attached assessment:  Deep  Lips flanged:  Yes.    Lips untucked:  No.  Suck assessment:  Displays both- nutritive for the first few minutes, then Samson Fredericlla was getting sleepy   Pre-feed weight:  3522 g  (7 lb. 12.2 oz.) Post-feed weight:  3528 g (7 lb. 12.5 oz.) Amount transferred:  6 ml Amount supplemented:  0 ml  Mom here for feeding assist- wants to get baby off NS if possible. [facility] been [facility]. Mom has not been supplementing since her milk came in and baby was gaining weight. Has been using the NS. Assisted with latch. Samson Fredericlla latched easily and nursed for 15 minutes. Mom reports that breast feels softer after nursing. Latched to other breast easily without the NS. Praise given.  Mom has her new Medela pump- reviewed setup and cleaning of pump pieces. No further questions at present. To call prn.    Total amount transferred: 50 ml Total supplement given:  0 ml

## 2014-06-29 ENCOUNTER — Encounter (HOSPITAL_COMMUNITY): Payer: Self-pay | Admitting: Obstetrics & Gynecology

## 2014-11-11 ENCOUNTER — Encounter (HOSPITAL_COMMUNITY): Payer: Self-pay

## 2014-11-13 ENCOUNTER — Ambulatory Visit (HOSPITAL_COMMUNITY)
Admission: RE | Admit: 2014-11-13 | Discharge: 2014-11-13 | Disposition: A | Discharge status: Home / Self Care | Payer: BLUE CROSS/BLUE SHIELD | Source: Ambulatory Visit | Attending: Obstetrics and Gynecology | Admitting: Obstetrics and Gynecology

## 2014-11-13 NOTE — Lactation Note (Addendum)
Lactation Consult  Mother's reason for visit:  Mom concerned about milk supply Visit Type:  Outpatient - Feeding assessment Appointment Notes:  Mom has noticed with pumping she is receiving less breast milk. Baby has become fussy at the breast - pulling on/off during the feeding. Mom reports her usual routine is to pump around 5:00AM receiving 8-10 oz of breast milk. Baby will nurse around [phone] for 15 minutes before Mom goes to work. She pumps 3 times/day at work usually every 3 hours occasionally 4 hours receiving about 2 1/2 - 4 oz total. She was receiving 22-23 oz a day with pumping but this has decreased to 19-20 oz per day. She will BF the baby when she gets home and then another 1-2 times before bed. Baby is at the breast 3-4 times/day. Baby is taking 4 to 4 1/2 oz of EBM via bottle every 2 - 2 1/2 hours at daycare. Mom reports [facility] 12 lb. 2.0 oz. Mom reports baby does have reflux. Spits up after feedings most days but will have some days she does not spit up at all. Baby did spit up small amount after each feeding at this visit with burping. Baby does not take any medication for reflux. Mom reports she is not fussy with gas/reflux.  Consult:  Initial Lactation Consultant:  [name], [name]  ________________________________________________________________________    ________________________________________________________________________  Mother's Name: Doris CoderJessica Long Type of delivery:  C/S Breastfeeding Experience:  P1 Maternal Medical Conditions:  Asthma   ________________________________________________________________________  Breastfeeding History (Post Discharge)  Frequency of breastfeeding:  3-4 times/day Duration of feeding:  10-15 minutes  Supplementation Breastmilk:  Volume 120-135 ml Frequency:  Every 2- 2 1/2 hours Total volume per day:  720-960 ml or more  Method:  Bottle,   Pumping  Type of pump:  Medela pump in  style Frequency:  4 times/day Volume:  240-300 ml 1st pumping of the day, then at work Mom pumps 3 times/day receiving 570-600 ml (19-20 oz) or 2 1/2 - 4 oz with each pumping. Prior to the last few weeks she was obtaining 4 oz each pumping at work.   Infant Intake and Output Assessment  Voids:  8-9 in 24 hrs.  Color:  Clear yellow Stools:  1 in 24 hrs.  Color:  Yellow  ________________________________________________________________________  Maternal Breast Assessment  Breast:  Soft Nipple:  Erect Pain level:  0 Pain interventions:  N/A _______________________________________________________________________ Feeding Assessment/Evaluation  Initial feeding assessment:  Infant's oral assessment:  Variance.  Baby appears to have thickness to the labial frenulum. The lingual frenulum is posterior, tight with finger sweep. Baby however has good tongue mobility.   Positioning:  Cross cradle Right breast  LATCH documentation:  Latch:  2 = Grasps breast easily, tongue down, lips flanged, rhythmical sucking.  Audible swallowing:  2 = Spontaneous and intermittent  Type of nipple:  2 = Everted at rest and after stimulation  Comfort (Breast/Nipple):  2 = Soft / non-tender  Hold (Positioning):  2 = No assistance needed to correctly position infant at breast  LATCH score:  10  Attached assessment:  Deep  Lips flanged:  No.Lower lip stays well flanged the upper lip will tuck slightly  Lips untucked:  No.  Suck assessment:  Nutritive  Tools:  Bottle Instructed on use and cleaning of tool:  Yes.    Pre-feed weight:  5672 g  (12 lb. 8.0 oz.) Post-feed weight:  5722 g (12  lb. 9.8 oz.) Amount transferred:  50 ml  Baby nursed for about 7 minutes. About 5 minutes into nursing baby begins to pull on/off the breast, begins to chew at the breast with suckling, becomes fussy.  Amount supplemented:  0 ml  Additional Feeding Assessment -   Infant's oral assessment:  Variance  Positioning:   Cross cradle Left breast  LATCH documentation:  Latch:  2 = Grasps breast easily, tongue down, lips flanged, rhythmical sucking.  Audible swallowing:  2 = Spontaneous and intermittent  Type of nipple:  2 = Everted at rest and after stimulation  Comfort (Breast/Nipple):  2 = Soft / non-tender  Hold (Positioning):  2 = No assistance needed to correctly position infant at breast  LATCH score: 10  Attached assessment:  Deep  Lips flanged:  No.  Lips untucked:  No.  Suck assessment:  Nutritive  Tools:  Bottle Instructed on use and cleaning of tool:  Yes.    Pre-feed weight:  5722 g  (12 lb. 9.8 oz.) Post-feed weight:  5740 g (12 lb. 10.5 oz.) Amount transferred:  18 ml. Same experience as with the right breast. After about 5 minutes into nursing baby starts to pull on/off the breast, chewing at the breast, fussy. Amount supplemented:  75 ml  EBM via bottle   Total amount pumped post feed:  R 0 ml    L 2 ml  With pumping for 13 minutes using Symphony pump  Total amount transferred:  68 ml Total supplement given:  75 ml  Mom last pumped at 0500 this am receiving 9 1/2 oz of breast milk. Baby last BF at 0630 for 20 minutes. Based on the pre/post weight and Mom's volume with post pumping her milk supply has decreased. Advised Mom the fussiness and pulling/chewing at the breast is due to the slowing of milk flow/volume with baby nursing. The baby is trying to get more milk volume.  Per Mom's report - there is a long space of time at night that her breasts are not emptied as the baby is sleeping thru the night and her last feeding may be as early as 8-9:00pm. Mom reports her breasts are very full with the early am pumping.  All of this has probably contributed to her decrease in milk volume.  Discussed the baby's frenulum with Mom but baby appears to have good mobility and LC not sure this has played a part in Mom's decreased milk supply. Mom not sure of her long term goals with BF either.  Gave Mom some website info on "tongue-tie" to research, but advised if her long term goals are to stop BF after 6 months she may not want to pursue a frenotomy for her baby. She would need further evaluation/diagnosis if this was her plan.  Plan discussed with Mom: Continue to pump 1st thing in the am to remove some volume before nursing baby to minimize reflux with 1st feeding. BF before going to work, but add an additional pumping before going to work after the baby's 1st BF session. Pump at least her 3 times at work. BF baby when she gets home but post pump for 10-15 minutes after this feeding and at least 1-2 more times before bed. Her goal is to be sure her breasts are emptied after no longer than 5 hours at night.  Supplement baby after breastfeeding with 2 - 3 oz of EBM, especially with evening feedings.  Start supplements - Lactation support to help increase milk production.  F/u lactation appointment scheduled for Tuesday, 11/24/14 at 0900 to re-assess milk transfer and milk supply.

## 2014-11-17 ENCOUNTER — Ambulatory Visit (HOSPITAL_COMMUNITY): Payer: Self-pay

## 2014-11-24 ENCOUNTER — Ambulatory Visit (HOSPITAL_COMMUNITY)
Admission: RE | Admit: 2014-11-24 | Discharge: 2014-11-24 | Disposition: A | Discharge status: Home / Self Care | Payer: BLUE CROSS/BLUE SHIELD | Source: Ambulatory Visit | Attending: Obstetrics & Gynecology | Admitting: Obstetrics & Gynecology

## 2014-11-24 NOTE — Lactation Note (Signed)
Lactation Consult  Mother's reason for visit:  Mother's visit today is for weight check and  feeding assessment. Mother has been pumping 6-8 times daily . She states that she has increased supply from 18 ounces to 28-30 ounces. Mother states she is very tired with all the pumping. She states that [name] is beginning to decrease feeding time at the breast. She states she feeds well in the early morning and one night time feeding. All other feedings she latches and pushes away aft only a few minutes, then wanting the bottle only. Mother is using a Dr Manson PasseyBrown premmie nipple. She states that [name] take 4 ounces every 2-3 hours. Mother is unsure of her longterm goal at this point due to the fatigue and business of pumping. She also works 4 full days each week.   Visit Type: feeding assessment Appointment Notes:  Consult:  Follow-Up Lactation Consultant:  Michel BickersKendrick, Clarie Camey McCoy  ________________________________________________________________________  Joan FloresBaby's Name: [name] Date of Birth: [birth year] Pediatrician: Dr Phillips OdorGolding Gender: female [facility] Age: <None[facility]  Weight on March 18 Out patient visit, 12-8 ,      Weight today: 12-15.1, 5872, [name] has had a gain of 7 ounces in 11 days.      Admission Information     Provider Service Admission Date      11/24/2014          ADT Events       Unit Room Bed Service Event    [facility]    [facility]      Weight Information (since admission)     None     Weights    Go to now       11/11/14 - Today         One Day Eight Hours  View All      Time:        ###[phone]         ________________________________________________________________________  Mother's Name: [name] Type of delivery:  C-Section Breastfeeding Experience:  none Maternal Medical Conditions:   Asthma Maternal Medications:  Prenatal vits.  ________________________________________________________________________  Frequency of breastfeeding: every 2-3 hours  Duration of feeding: 3-5 mins  Supplementation  Breastmilk:  Volume 120  Ml every 2-3 hours  Method:  Bottle, Dr Manson PasseyBrown  Pumping  Type of pump:  Medela pump in style Frequency:  6-8 times daily Volume: 3-5 ounces   Infant Intake and Output Assessment  Voids:  6-10 in 24 hrs.  Color:  Clear yellow Stools:  1 in 24 hrs.  Color:  Yellow  ________________________________________________________________________  Maternal Breast Assessment  Breast:  Full Nipple:  Erect Pain level:  0 Pain interventions:  Bra  _______________________________________________________________________ Feeding Assessment/Evaluation:  observed infant latch for 1-2 mins and then began pulling off the breast. Mother placed in back to breast several times and used alternate breast. Infant sustained latch for 8-10 mins. She transferred 58 ml. I fed [name] 3 ounces of EBM with a bottle while mother pumped.    Infant's oral assessment:  WNL  Positioning:  Cradle Right breast  LATCH documentation:  Latch:  2 = Grasps breast easily, tongue down, lips flanged, rhythmical sucking.  Audible swallowing:  2 = Spontaneous and intermittent  Type of nipple:  2 = Everted at rest and after stimulation  Comfort (Breast/Nipple):  1 = Filling, red/small blisters or bruises, mild/mod discomfort  Hold (Positioning):  2 = No assistance needed to correctly position infant at breast  LATCH score:  9  Attached assessment:  Deep  Lips flanged:  Yes.    Lips untucked:  Yes.    Suck assessment:  Nutritive  Pre-feed weight: 5872  g , 12-15.1lb Post-feed weight:  5930 g, 13-1 Amount transferred:   58 ml Amount supplemented: 90   ml    Total amount pumped post feed:  R 45 ml    L 45 ml  Total amount transferred:  58 ml Total supplement given:  90  ml  Advised mother to evaluate her feeding goals Lots of ideas to continue with breastfeeding as desired Discussed development behaviors and eating complimentary  food in one month Also advised mother to accept transition if she chooses to wean Doris Frederic to a bottle only Mother has 500 ounces of EBM in freezer and available Donor milk if needed. Suggested that mother use a level one bottle nipple (advised in pace bottle feeding) and to offer infant up to 5 ounces with each feeding if not spitting . Mother plans to follow up with Peds for weight check in 2 weeks  She has a 6 month visit scheduled on May 2. with Dr Phillips Odor

## 2015-10-21 ENCOUNTER — Other Ambulatory Visit (HOSPITAL_COMMUNITY): Payer: Self-pay | Admitting: Family Medicine

## 2015-10-21 DIAGNOSIS — R109 Unspecified abdominal pain: Secondary | ICD-10-CM

## 2015-10-26 ENCOUNTER — Encounter (HOSPITAL_COMMUNITY)
Admission: RE | Admit: 2015-10-26 | Discharge: 2015-10-26 | Disposition: A | Discharge status: Home / Self Care | Payer: BC Managed Care – PPO | Source: Ambulatory Visit | Attending: Family Medicine | Admitting: Family Medicine

## 2015-10-26 DIAGNOSIS — R109 Unspecified abdominal pain: Secondary | ICD-10-CM | POA: Insufficient documentation

## 2015-10-26 MED ORDER — SINCALIDE 5 MCG IJ SOLR
INTRAMUSCULAR | Status: AC
  Administered 2015-10-26: 2.4 ug via INTRAVENOUS
  Filled 2015-10-26: qty 5

## 2015-10-26 MED ORDER — SODIUM CHLORIDE 0.9% FLUSH
INTRAVENOUS | Status: AC
  Filled 2015-10-26: qty 40

## 2015-10-26 MED ORDER — TECHNETIUM TC 99M MEBROFENIN IV KIT
5.0000 | PACK | Freq: Once | INTRAVENOUS | Status: AC | PRN
  Administered 2015-10-26: 5.3 via INTRAVENOUS

## 2015-10-26 MED ORDER — TECHNETIUM TC 99M DISOFENIN: 5.0000 | Freq: Once | Status: DC | PRN

## 2015-10-26 MED ORDER — STERILE WATER FOR INJECTION IJ SOLN
INTRAMUSCULAR | Status: AC
  Administered 2015-10-26: 2.4 mL via INTRAVENOUS
  Filled 2015-10-26: qty 10

## 2016-01-14 LAB — OB RESULTS CONSOLE ANTIBODY SCREEN: Antibody Screen: NEGATIVE

## 2016-01-14 LAB — OB RESULTS CONSOLE RUBELLA ANTIBODY, IGM: Rubella: IMMUNE

## 2016-01-14 LAB — OB RESULTS CONSOLE HEPATITIS B SURFACE ANTIGEN: HEP B S AG: NEGATIVE

## 2016-01-14 LAB — OB RESULTS CONSOLE ABO/RH: RH TYPE: POSITIVE

## 2016-01-14 LAB — OB RESULTS CONSOLE GC/CHLAMYDIA
Chlamydia: NEGATIVE
Gonorrhea: NEGATIVE

## 2016-01-14 LAB — OB RESULTS CONSOLE RPR: RPR: NONREACTIVE

## 2016-01-14 LAB — OB RESULTS CONSOLE HIV ANTIBODY (ROUTINE TESTING): HIV: NONREACTIVE

## 2016-07-12 LAB — OB RESULTS CONSOLE GBS: STREP GROUP B AG: POSITIVE

## 2016-07-18 ENCOUNTER — Encounter (HOSPITAL_COMMUNITY): Payer: Self-pay | Admitting: *Deleted

## 2016-07-18 ENCOUNTER — Telehealth (HOSPITAL_COMMUNITY): Payer: Self-pay | Admitting: *Deleted

## 2016-07-18 NOTE — Telephone Encounter (Signed)
Preadmission screen  

## 2016-07-19 ENCOUNTER — Encounter (HOSPITAL_COMMUNITY): Payer: Self-pay

## 2016-07-20 NOTE — H&P (Signed)
[name] is a 30 y.o. female presenting for repeat C/S, declines TOLAC. The patient's BPs have been elevated x 2 weeks; all mild range and not requiring medication.  She has had no s/sx of pre-eclampsia otherwise.  Antepartum course otherwise uncomplicated.  GBS positive.  OB History    Gravida Para Term Preterm AB Living   2 1 1     1    SAB TAB Ectopic Multiple Live Births           1     Past Medical History:  Diagnosis Date  . Asthma    Past Surgical History:  Procedure Laterality Date  . CESAREAN SECTION N/A 06/20/2014   Procedure: CESAREAN SECTION;  Surgeon: Mitchel HonourMegan Nysia Dell, DO;  Location: [facility];  Service: Obstetrics;  Laterality: N/A;  . TONSILLECTOMY AND ADENOIDECTOMY    . WRIST GANGLION EXCISION     left wrist   Family History: family history includes Cancer in her maternal aunt and mother; Thyroid disease in her maternal aunt and paternal aunt. Social History:  reports that she has never smoked. She has never used smokeless tobacco. She reports that she does not drink alcohol or use drugs.     Maternal Diabetes: No Genetic Screening: Normal Maternal Ultrasounds/Referrals: Normal Fetal Ultrasounds or other Referrals:  None Maternal Substance Abuse:  No Significant Maternal Medications:  None Significant Maternal Lab Results:  Lab values include: Group B Strep positive Other Comments:  None  ROS Maternal Medical History:  Fetal activity: Perceived fetal activity is normal.    Prenatal complications: no prenatal complications Prenatal Complications - Diabetes: none.      Last menstrual period 11/01/2015, unknown if currently breastfeeding. Maternal Exam:  Abdomen: Patient reports no abdominal tenderness. Surgical scars: low transverse.   Fundal height is S>D.   Estimated fetal weight is 8#12.       Physical Exam  Constitutional: She is oriented to person, place, and time. She appears well-developed and well-nourished.  GI: Soft. There is no rebound  and no guarding.  Neurological: She is alert and oriented to person, place, and time.  Skin: Skin is warm and dry.  Psychiatric: She has a normal mood and affect. Her behavior is normal.    Prenatal labs: ABO, Rh: A/Positive/-- (05/19 0000) Antibody: Negative (05/19 0000) Rubella: Immune (05/19 0000) RPR: Nonreactive (05/19 0000)  HBsAg: Negative (05/19 0000)  HIV: Non-reactive (05/19 0000)  GBS: Positive (11/15 0000)   Assessment/Plan: 29yo G2P1001 at 38 weeks with GHTN and for repeat C/S -Patient is counseled re: risk of bleeding, infection, scarring and damage to surrounding structures.  All questions were answered and the patient wishes to proceed.  [name] 07/20/2016, 4:28 PM

## 2016-07-24 ENCOUNTER — Encounter (HOSPITAL_COMMUNITY)
Admission: RE | Admit: 2016-07-24 | Discharge: 2016-07-24 | Disposition: A | Discharge status: Home / Self Care | Payer: BC Managed Care – PPO | Source: Ambulatory Visit | Attending: Obstetrics & Gynecology | Admitting: Obstetrics & Gynecology

## 2016-07-24 LAB — CBC
HEMATOCRIT: 33.3 % — AB (ref 36.0–46.0)
HEMOGLOBIN: 11.2 g/dL — AB (ref 12.0–15.0)
MCH: 27.1 pg (ref 26.0–34.0)
MCHC: 33.6 g/dL (ref 30.0–36.0)
MCV: 80.4 fL (ref 78.0–100.0)
Platelets: 295 10*3/uL (ref 150–400)
RBC: 4.14 MIL/uL (ref 3.87–5.11)
RDW: 13.4 % (ref 11.5–15.5)
WBC: 11.7 10*3/uL — AB (ref 4.0–10.5)

## 2016-07-24 LAB — BASIC METABOLIC PANEL
ANION GAP: 9 (ref 5–15)
BUN: 6 mg/dL (ref 6–20)
CHLORIDE: 102 mmol/L (ref 101–111)
CO2: 22 mmol/L (ref 22–32)
Calcium: 9.3 mg/dL (ref 8.9–10.3)
Creatinine, Ser: 0.52 mg/dL (ref 0.44–1.00)
Glucose, Bld: 89 mg/dL (ref 65–99)
POTASSIUM: 3.8 mmol/L (ref 3.5–5.1)
SODIUM: 133 mmol/L — AB (ref 135–145)

## 2016-07-24 NOTE — Patient Instructions (Signed)
21 [name]  07/24/2016   Your procedure is scheduled on:  07/26/2016  Enter through the [facility] at 0600 AM.  Pick up the phone at the desk and dial [phone].   Call this number if you have problems the morning of surgery: [phone]   Remember:   Do not eat food:After Midnight.  Do not drink clear liquids: After Midnight.  Take these medicines the morning of surgery with A SIP OF WATER: NONE   Do not wear jewelry, make-up or nail polish.  Do not wear lotions, powders, or perfumes. Do not wear deodorant.  Do not shave 48 hours prior to surgery.  Do not bring valuables to the hospital.  [facility] is not   responsible for any belongings or valuables brought to the hospital.  Contacts, dentures or bridgework may not be worn into surgery.  Leave suitcase in the car. After surgery it may be brought to your room.  For patients admitted to the hospital, checkout time is 11:00 AM the day of              discharge.   Patients discharged the day of surgery will not be allowed to drive             home.  Name and phone number of your driver: NA  Special Instructions:   N/A   Please read over the following fact sheets that you were given:   Surgical Site Infection Prevention

## 2016-07-25 ENCOUNTER — Encounter (HOSPITAL_COMMUNITY)
Admission: RE | Admit: 2016-07-25 | Discharge: 2016-07-25 | Disposition: A | Discharge status: Home / Self Care | Payer: BC Managed Care – PPO | Source: Ambulatory Visit | Attending: Obstetrics & Gynecology | Admitting: Obstetrics & Gynecology

## 2016-07-25 LAB — RPR: RPR: NONREACTIVE

## 2016-07-25 MED ORDER — DEXTROSE 5 % IV SOLN
3.0000 g | INTRAVENOUS | Status: AC
  Administered 2016-07-26: 3 g via INTRAVENOUS
  Filled 2016-07-25: qty 3000

## 2016-07-26 ENCOUNTER — Encounter (HOSPITAL_COMMUNITY)
Admission: RE | Disposition: A | Discharge status: Home / Self Care | Payer: Self-pay | Source: Ambulatory Visit | Attending: Obstetrics & Gynecology

## 2016-07-26 ENCOUNTER — Inpatient Hospital Stay (HOSPITAL_COMMUNITY): Payer: BC Managed Care – PPO | Admitting: Anesthesiology

## 2016-07-26 ENCOUNTER — Inpatient Hospital Stay (HOSPITAL_COMMUNITY)
Admission: RE | Admit: 2016-07-26 | Discharge: 2016-07-28 | DRG: 765 | Disposition: A | Discharge status: Home / Self Care | Payer: BC Managed Care – PPO | Source: Ambulatory Visit | Attending: Obstetrics & Gynecology | Admitting: Obstetrics & Gynecology

## 2016-07-26 ENCOUNTER — Encounter (HOSPITAL_COMMUNITY): Payer: Self-pay | Admitting: Emergency Medicine

## 2016-07-26 DIAGNOSIS — Z3A38 38 weeks gestation of pregnancy: Secondary | ICD-10-CM

## 2016-07-26 DIAGNOSIS — O99214 Obesity complicating childbirth: Secondary | ICD-10-CM | POA: Diagnosis present

## 2016-07-26 DIAGNOSIS — O34211 Maternal care for low transverse scar from previous cesarean delivery: Secondary | ICD-10-CM | POA: Diagnosis present

## 2016-07-26 DIAGNOSIS — O99824 Streptococcus B carrier state complicating childbirth: Secondary | ICD-10-CM | POA: Diagnosis present

## 2016-07-26 DIAGNOSIS — J45909 Unspecified asthma, uncomplicated: Secondary | ICD-10-CM | POA: Diagnosis present

## 2016-07-26 DIAGNOSIS — Z6841 Body Mass Index (BMI) 40.0 and over, adult: Secondary | ICD-10-CM | POA: Diagnosis not present

## 2016-07-26 DIAGNOSIS — O9952 Diseases of the respiratory system complicating childbirth: Secondary | ICD-10-CM | POA: Diagnosis present

## 2016-07-26 DIAGNOSIS — Z98891 History of uterine scar from previous surgery: Secondary | ICD-10-CM

## 2016-07-26 DIAGNOSIS — O134 Gestational [pregnancy-induced] hypertension without significant proteinuria, complicating childbirth: Secondary | ICD-10-CM | POA: Diagnosis present

## 2016-07-26 HISTORY — DX: Nausea with vomiting, unspecified: R11.2

## 2016-07-26 HISTORY — DX: Other specified postprocedural states: Z98.890

## 2016-07-26 LAB — PREPARE RBC (CROSSMATCH)

## 2016-07-26 SURGERY — Surgical Case
Anesthesia: Spinal

## 2016-07-26 MED ORDER — NALOXONE HCL 2 MG/2ML IJ SOSY
1.0000 ug/kg/h | PREFILLED_SYRINGE | INTRAVENOUS | Status: DC | PRN
  Filled 2016-07-26 (×19): qty 2

## 2016-07-26 MED ORDER — PHENYLEPHRINE 8 MG IN D5W 100 ML (0.08MG/ML) PREMIX OPTIME
INJECTION | INTRAVENOUS | Status: DC | PRN
  Administered 2016-07-26: 60 ug/min via INTRAVENOUS

## 2016-07-26 MED ORDER — SENNOSIDES-DOCUSATE SODIUM 8.6-50 MG PO TABS
2.0000 | ORAL_TABLET | ORAL | Status: DC
  Administered 2016-07-26 – 2016-07-28 (×2): 2 via ORAL
  Filled 2016-07-26 (×2): qty 2

## 2016-07-26 MED ORDER — OXYCODONE-ACETAMINOPHEN 5-325 MG PO TABS
2.0000 | ORAL_TABLET | ORAL | Status: DC | PRN
  Administered 2016-07-27: 2 via ORAL
  Filled 2016-07-26: qty 2

## 2016-07-26 MED ORDER — OXYTOCIN 40 UNITS IN LACTATED RINGERS INFUSION - SIMPLE MED: 2.5000 [IU]/h | INTRAVENOUS | Status: AC

## 2016-07-26 MED ORDER — LACTATED RINGERS IV SOLN
INTRAVENOUS | Status: DC
  Administered 2016-07-26: 125 mL/h via INTRAVENOUS
  Administered 2016-07-26: 08:00:00 via INTRAVENOUS

## 2016-07-26 MED ORDER — DIBUCAINE 1 % RE OINT: 1.0000 "application " | TOPICAL_OINTMENT | RECTAL | Status: DC | PRN

## 2016-07-26 MED ORDER — OXYTOCIN 10 UNIT/ML IJ SOLN
INTRAVENOUS | Status: DC | PRN
  Administered 2016-07-26: 40 [IU] via INTRAVENOUS

## 2016-07-26 MED ORDER — NALBUPHINE HCL 10 MG/ML IJ SOLN: 5.0000 mg | Freq: Once | INTRAMUSCULAR | Status: DC | PRN

## 2016-07-26 MED ORDER — DEXAMETHASONE SODIUM PHOSPHATE 4 MG/ML IJ SOLN
INTRAMUSCULAR | Status: DC | PRN
  Administered 2016-07-26: 4 mg via INTRAVENOUS

## 2016-07-26 MED ORDER — BUPIVACAINE IN DEXTROSE 0.75-8.25 % IT SOLN
INTRATHECAL | Status: DC | PRN
  Administered 2016-07-26: 1.8 mL via INTRATHECAL

## 2016-07-26 MED ORDER — LACTATED RINGERS IV SOLN
INTRAVENOUS | Status: DC
  Administered 2016-07-26: 23:00:00 via INTRAVENOUS

## 2016-07-26 MED ORDER — DIPHENHYDRAMINE HCL 50 MG/ML IJ SOLN: 12.5000 mg | INTRAMUSCULAR | Status: DC | PRN

## 2016-07-26 MED ORDER — SODIUM CHLORIDE 0.9% FLUSH: 3.0000 mL | INTRAVENOUS | Status: DC | PRN

## 2016-07-26 MED ORDER — PHENYLEPHRINE 8 MG IN D5W 100 ML (0.08MG/ML) PREMIX OPTIME
INJECTION | INTRAVENOUS | Status: AC
  Filled 2016-07-26: qty 100

## 2016-07-26 MED ORDER — FENTANYL CITRATE (PF) 100 MCG/2ML IJ SOLN
INTRAMUSCULAR | Status: AC
  Filled 2016-07-26: qty 2

## 2016-07-26 MED ORDER — KETOROLAC TROMETHAMINE 30 MG/ML IJ SOLN: 30.0000 mg | Freq: Four times a day (QID) | INTRAMUSCULAR | Status: AC | PRN

## 2016-07-26 MED ORDER — OXYTOCIN 10 UNIT/ML IJ SOLN
INTRAMUSCULAR | Status: AC
  Filled 2016-07-26: qty 4

## 2016-07-26 MED ORDER — PROMETHAZINE HCL 25 MG/ML IJ SOLN: 6.2500 mg | INTRAMUSCULAR | Status: DC | PRN

## 2016-07-26 MED ORDER — MORPHINE SULFATE-NACL 0.5-0.9 MG/ML-% IV SOSY
PREFILLED_SYRINGE | INTRAVENOUS | Status: AC
  Filled 2016-07-26: qty 1

## 2016-07-26 MED ORDER — MENTHOL 3 MG MT LOZG: 1.0000 | LOZENGE | OROMUCOSAL | Status: DC | PRN

## 2016-07-26 MED ORDER — SCOPOLAMINE 1 MG/3DAYS TD PT72
1.0000 | MEDICATED_PATCH | Freq: Once | TRANSDERMAL | Status: DC
  Administered 2016-07-26: 1.5 mg via TRANSDERMAL

## 2016-07-26 MED ORDER — LACTATED RINGERS IV SOLN
Freq: Once | INTRAVENOUS | Status: AC
  Administered 2016-07-26: 06:00:00 via INTRAVENOUS

## 2016-07-26 MED ORDER — DIPHENHYDRAMINE HCL 25 MG PO CAPS: 25.0000 mg | ORAL_CAPSULE | ORAL | Status: DC | PRN

## 2016-07-26 MED ORDER — MEPERIDINE HCL 25 MG/ML IJ SOLN
6.2500 mg | INTRAMUSCULAR | Status: DC | PRN
Start: 2016-07-26 — End: 2016-07-26

## 2016-07-26 MED ORDER — NALBUPHINE HCL 10 MG/ML IJ SOLN: 5.0000 mg | INTRAMUSCULAR | Status: DC | PRN

## 2016-07-26 MED ORDER — ZOLPIDEM TARTRATE 5 MG PO TABS: 5.0000 mg | ORAL_TABLET | Freq: Every evening | ORAL | Status: DC | PRN

## 2016-07-26 MED ORDER — SIMETHICONE 80 MG PO CHEW
80.0000 mg | CHEWABLE_TABLET | ORAL | Status: DC
  Administered 2016-07-26 – 2016-07-28 (×2): 80 mg via ORAL
  Filled 2016-07-26 (×2): qty 1

## 2016-07-26 MED ORDER — SCOPOLAMINE 1 MG/3DAYS TD PT72
MEDICATED_PATCH | TRANSDERMAL | Status: AC
  Administered 2016-07-26: 1.5 mg via TRANSDERMAL
  Filled 2016-07-26: qty 1

## 2016-07-26 MED ORDER — ONDANSETRON HCL 4 MG/2ML IJ SOLN
INTRAMUSCULAR | Status: AC
  Filled 2016-07-26: qty 2

## 2016-07-26 MED ORDER — FENTANYL CITRATE (PF) 100 MCG/2ML IJ SOLN: 25.0000 ug | INTRAMUSCULAR | Status: DC | PRN

## 2016-07-26 MED ORDER — TETANUS-DIPHTH-ACELL PERTUSSIS 5-2.5-18.5 LF-MCG/0.5 IM SUSP: 0.5000 mL | Freq: Once | INTRAMUSCULAR | Status: DC

## 2016-07-26 MED ORDER — ACETAMINOPHEN 325 MG PO TABS
650.0000 mg | ORAL_TABLET | ORAL | Status: DC | PRN
  Administered 2016-07-27 – 2016-07-28 (×3): 650 mg via ORAL
  Filled 2016-07-26 (×3): qty 2

## 2016-07-26 MED ORDER — SIMETHICONE 80 MG PO CHEW
80.0000 mg | CHEWABLE_TABLET | Freq: Three times a day (TID) | ORAL | Status: DC
  Administered 2016-07-26 – 2016-07-28 (×6): 80 mg via ORAL
  Filled 2016-07-26 (×5): qty 1

## 2016-07-26 MED ORDER — IBUPROFEN 600 MG PO TABS
600.0000 mg | ORAL_TABLET | Freq: Four times a day (QID) | ORAL | Status: DC
  Administered 2016-07-26 – 2016-07-28 (×9): 600 mg via ORAL
  Filled 2016-07-26 (×9): qty 1

## 2016-07-26 MED ORDER — MORPHINE SULFATE (PF) 0.5 MG/ML IJ SOLN
INTRAMUSCULAR | Status: DC | PRN
  Administered 2016-07-26: .2 mg via INTRATHECAL

## 2016-07-26 MED ORDER — LACTATED RINGERS IV SOLN
INTRAVENOUS | Status: DC
  Administered 2016-07-26: 08:00:00 via INTRAVENOUS

## 2016-07-26 MED ORDER — DIPHENHYDRAMINE HCL 25 MG PO CAPS: 25.0000 mg | ORAL_CAPSULE | Freq: Four times a day (QID) | ORAL | Status: DC | PRN

## 2016-07-26 MED ORDER — SODIUM CHLORIDE 0.9 % IR SOLN
Status: DC | PRN
  Administered 2016-07-26: 1000 mL

## 2016-07-26 MED ORDER — NALOXONE HCL 0.4 MG/ML IJ SOLN: 0.4000 mg | INTRAMUSCULAR | Status: DC | PRN

## 2016-07-26 MED ORDER — OXYCODONE-ACETAMINOPHEN 5-325 MG PO TABS
1.0000 | ORAL_TABLET | ORAL | Status: DC | PRN
  Administered 2016-07-28: 1 via ORAL
  Filled 2016-07-26: qty 1

## 2016-07-26 MED ORDER — PRENATAL MULTIVITAMIN CH
1.0000 | ORAL_TABLET | Freq: Every day | ORAL | Status: DC
  Administered 2016-07-26 – 2016-07-28 (×3): 1 via ORAL
  Filled 2016-07-26 (×3): qty 1

## 2016-07-26 MED ORDER — WITCH HAZEL-GLYCERIN EX PADS: 1.0000 "application " | MEDICATED_PAD | CUTANEOUS | Status: DC | PRN

## 2016-07-26 MED ORDER — FENTANYL CITRATE (PF) 100 MCG/2ML IJ SOLN
INTRAMUSCULAR | Status: DC | PRN
  Administered 2016-07-26: 10 ug via INTRATHECAL

## 2016-07-26 MED ORDER — SIMETHICONE 80 MG PO CHEW: 80.0000 mg | CHEWABLE_TABLET | ORAL | Status: DC | PRN

## 2016-07-26 MED ORDER — DEXAMETHASONE SODIUM PHOSPHATE 4 MG/ML IJ SOLN
INTRAMUSCULAR | Status: AC
  Filled 2016-07-26: qty 1

## 2016-07-26 MED ORDER — ONDANSETRON HCL 4 MG/2ML IJ SOLN
INTRAMUSCULAR | Status: DC | PRN
  Administered 2016-07-26: 4 mg via INTRAVENOUS

## 2016-07-26 MED ORDER — ACETAMINOPHEN 500 MG PO TABS
1000.0000 mg | ORAL_TABLET | Freq: Four times a day (QID) | ORAL | Status: AC
  Administered 2016-07-26 (×3): 1000 mg via ORAL
  Filled 2016-07-26 (×3): qty 2

## 2016-07-26 MED ORDER — ONDANSETRON HCL 4 MG/2ML IJ SOLN: 4.0000 mg | Freq: Three times a day (TID) | INTRAMUSCULAR | Status: DC | PRN

## 2016-07-26 MED ORDER — COCONUT OIL OIL: 1.0000 "application " | TOPICAL_OIL | Status: DC | PRN

## 2016-07-26 SURGICAL SUPPLY — 35 items
APL SKNCLS STERI-STRIP NONHPOA (GAUZE/BANDAGES/DRESSINGS) ×1
BENZOIN TINCTURE PRP APPL 2/3 (GAUZE/BANDAGES/DRESSINGS) ×3 IMPLANT
CHLORAPREP W/TINT 26ML (MISCELLANEOUS) ×3 IMPLANT
CLAMP CORD UMBIL (MISCELLANEOUS) IMPLANT
CLOSURE STERI STRIP 1/2 X4 (GAUZE/BANDAGES/DRESSINGS) ×3 IMPLANT
CLOSURE WOUND 1/2 X4 (GAUZE/BANDAGES/DRESSINGS)
CLOTH BEACON ORANGE TIMEOUT ST (SAFETY) ×3 IMPLANT
DERMABOND ADVANCED (GAUZE/BANDAGES/DRESSINGS)
DERMABOND ADVANCED .7 DNX12 (GAUZE/BANDAGES/DRESSINGS) IMPLANT
DRSG OPSITE POSTOP 4X10 (GAUZE/BANDAGES/DRESSINGS) ×3 IMPLANT
ELECT REM PT RETURN 9FT ADLT (ELECTROSURGICAL) ×3
ELECTRODE REM PT RTRN 9FT ADLT (ELECTROSURGICAL) ×1 IMPLANT
EXTRACTOR VACUUM KIWI (MISCELLANEOUS) IMPLANT
GLOVE BIO SURGEON STRL SZ 6 (GLOVE) ×3 IMPLANT
GLOVE BIOGEL PI IND STRL 6 (GLOVE) ×2 IMPLANT
GLOVE BIOGEL PI IND STRL 7.0 (GLOVE) ×1 IMPLANT
GLOVE BIOGEL PI INDICATOR 6 (GLOVE) ×4
GLOVE BIOGEL PI INDICATOR 7.0 (GLOVE) ×2
GOWN STRL REUS W/TWL LRG LVL3 (GOWN DISPOSABLE) ×6 IMPLANT
KIT ABG SYR 3ML LUER SLIP (SYRINGE) ×3 IMPLANT
NEEDLE HYPO 25X5/8 SAFETYGLIDE (NEEDLE) ×3 IMPLANT
NS IRRIG 1000ML POUR BTL (IV SOLUTION) ×3 IMPLANT
PACK C SECTION WH (CUSTOM PROCEDURE TRAY) ×3 IMPLANT
PAD OB MATERNITY 4.3X12.25 (PERSONAL CARE ITEMS) ×3 IMPLANT
PENCIL SMOKE EVAC W/HOLSTER (ELECTROSURGICAL) ×3 IMPLANT
STRIP CLOSURE SKIN 1/2X4 (GAUZE/BANDAGES/DRESSINGS) IMPLANT
SUT CHROMIC 0 CTX 36 (SUTURE) ×9 IMPLANT
SUT MON AB 2-0 CT1 27 (SUTURE) ×3 IMPLANT
SUT PDS AB 0 CT1 27 (SUTURE) IMPLANT
SUT PLAIN 0 NONE (SUTURE) IMPLANT
SUT PLAIN 2 0 XLH (SUTURE) ×3 IMPLANT
SUT VIC AB 0 CT1 36 (SUTURE) IMPLANT
SUT VIC AB 4-0 KS 27 (SUTURE) IMPLANT
TOWEL OR 17X24 6PK STRL BLUE (TOWEL DISPOSABLE) ×3 IMPLANT
TRAY FOLEY CATH SILVER 14FR (SET/KITS/TRAYS/PACK) IMPLANT

## 2016-07-26 NOTE — Transfer of Care (Signed)
Immediate Anesthesia Transfer of Care Note  Patient: [name]  Procedure(s) Performed: Procedure(s) with comments: CESAREAN SECTION (N/A) - Repeat edc 08/07/16 nkda request RNFA  Patient Location: PACU  Anesthesia Type:Spinal  Level of Consciousness: awake, alert  and oriented  Airway & Oxygen Therapy: Patient Spontanous Breathing  Post-op Assessment: Report given to RN and Post -op Vital signs reviewed and stable  Post vital signs: Reviewed and stable  Last Vitals:  Vitals:   07/26/16 0557  BP: (!) 139/97  Pulse: 96  Resp: 15  Temp: 36.6 C    Last Pain:  Vitals:   07/26/16 0557  TempSrc: Oral      Patients Stated Pain Goal: 3 (07/26/16 0557)  Complications: No apparent anesthesia complications

## 2016-07-26 NOTE — Progress Notes (Signed)
No change to H&P.  Audrey Thull, DO 

## 2016-07-26 NOTE — Anesthesia Preprocedure Evaluation (Signed)
Anesthesia Evaluation  Patient identified by MRN, date of birth, ID band Patient awake    Reviewed: Allergy & Precautions, NPO status , Patient's Chart, lab work & pertinent test results  History of Anesthesia Complications (+) PONV and history of anesthetic complications  Airway Mallampati: II  TM Distance: >3 FB Neck ROM: Full    Dental  (+) Teeth Intact, Dental Advisory Given   Pulmonary neg pulmonary ROS,    Pulmonary exam normal breath sounds clear to auscultation       Cardiovascular negative cardio ROS Normal cardiovascular exam Rhythm:Regular Rate:Normal     Neuro/Psych negative neurological ROS  negative psych ROS   GI/Hepatic negative GI ROS, Neg liver ROS,   Endo/Other  Morbid obesity  Renal/GU negative Renal ROS     Musculoskeletal negative musculoskeletal ROS (+)   Abdominal   Peds  Hematology negative hematology ROS (+)   Anesthesia Other Findings Day of surgery medications reviewed with the patient.  Reproductive/Obstetrics (+) Pregnancy                             Anesthesia Physical Anesthesia Plan  ASA: III  Anesthesia Plan: Spinal   Post-op Pain Management:    Induction:   Airway Management Planned:   Additional Equipment:   Intra-op Plan:   Post-operative Plan:   Informed Consent: I have reviewed the patients History and Physical, chart, labs and discussed the procedure including the risks, benefits and alternatives for the proposed anesthesia with the patient or authorized representative who has indicated his/her understanding and acceptance.   Dental advisory given  Plan Discussed with: CRNA, Anesthesiologist and Surgeon  Anesthesia Plan Comments: (Discussed risks and benefits of and differences between spinal and general. Discussed risks of spinal including headache, backache, failure, bleeding, infection, and nerve damage. Patient consents to  spinal. Questions answered. Coagulation studies and platelet count acceptable.)        Anesthesia Quick Evaluation

## 2016-07-26 NOTE — Op Note (Signed)
Doris CoderJessica Long PROCEDURE DATE: 07/26/2016  PREOPERATIVE DIAGNOSIS: Intrauterine pregnancy at  2634w2d weeks gestation, previous C/S x 1, Gestational hypertension  POSTOPERATIVE DIAGNOSIS: The same  PROCEDURE:  Repeat Low Transverse Cesarean Section  SURGEON:  Dr. Mitchel HonourMegan Adiah Guereca  INDICATIONS: Doris CoderJessica Fischel is a 30 y.o. G2P1001 at 1334w2d scheduled for cesarean section secondary to previous C/S with desire for repeat and gestational hypertension.  The risks of cesarean section discussed with the patient included but were not limited to: bleeding which may require transfusion or reoperation; infection which may require antibiotics; injury to bowel, bladder, ureters or other surrounding organs; injury to the fetus; need for additional procedures including hysterectomy in the event of a life-threatening hemorrhage; placental abnormalities wth subsequent pregnancies, incisional problems, thromboembolic phenomenon and other postoperative/anesthesia complications. The patient concurred with the proposed plan, giving informed written consent for the procedure.    FINDINGS:  Viable female infant in cephalic presentation, APGARs 9,9:  Weight pending  Clear amniotic fluid.  Intact placenta, three vessel cord.  Grossly normal uterus, ovaries and fallopian tubes. .   ANESTHESIA:  Spinal ESTIMATED BLOOD LOSS: 750 ml SPECIMENS: Placenta sent to L&D COMPLICATIONS: None immediate  PROCEDURE IN DETAIL:  The patient received intravenous antibiotics and had sequential compression devices applied to her lower extremities while in the preoperative area.  She was then taken to the operating room where spinal anesthesia was administered and was found to be adequate. She was then placed in a dorsal supine position with a leftward tilt, and prepped and draped in a sterile manner.  A foley catheter was placed into her bladder and attached to constant gravity.  After an adequate timeout was performed, a Pfannenstiel skin incision  was made with scalpel and carried through to the underlying layer of fascia. The fascia was incised in the midline and this incision was extended bilaterally using the Mayo scissors. Kocher clamps were applied to the superior aspect of the fascial incision and the underlying rectus muscles were dissected off bluntly. A similar process was carried out on the inferior aspect of the facial incision. The rectus muscles were separated in the midline bluntly and the peritoneum was entered bluntly. Bladder flap was created sharply and developed bluntly.  Bladder blade was placed.  A transverse hysterotomy was made with a scalpel and extended bilaterally bluntly. The bladder blade was then removed. The infant was successfully delivered using a single Kiwi vacuum pull, and cord was clamped and cut and infant was handed over to awaiting neonatology team. Uterine massage was then administered and the placenta delivered intact with three-vessel cord. The uterus was cleared of clot and debris.  The hysterotomy was closed with 0 chromic.  A second imbricating suture of 0-chromic was used to reinforce the incision and aid in hemostasis.  The peritoneum and rectus muscles were noted to be hemostatic.  The fascia was closed with 0-PDS in a running fashion with good restoration of anatomy.  The subcutaneus tissue was copiously irrigated and reapproximated using plain gut in three interrupted stitches.  The skin was closed with 4-0 vicryl in a subcuticular fashion.  Pt tolerated the procedure will.  All counts were correct x2.  Pt went to the recovery room in stable condition.

## 2016-07-26 NOTE — Anesthesia Postprocedure Evaluation (Signed)
Anesthesia Post Note  Patient: [name]  Procedure(s) Performed: Procedure(s) (LRB): CESAREAN SECTION (N/A)  Patient location during evaluation: PACU Anesthesia Type: Spinal Level of consciousness: oriented and awake and alert Pain management: pain level controlled Vital Signs Assessment: post-procedure vital signs reviewed and stable Respiratory status: spontaneous breathing, respiratory function stable and patient connected to nasal cannula oxygen Cardiovascular status: blood pressure returned to baseline and stable Postop Assessment: no headache, no backache, spinal receding, patient able to bend at knees and no signs of nausea or vomiting Anesthetic complications: no     Last Vitals:  Vitals:   07/26/16 0930 07/26/16 0945  BP: 114/66 127/74  Pulse: 79 80  Resp: 15 12  Temp: 36.4 C     Last Pain:  Vitals:   07/26/16 0930  TempSrc: Oral   Pain Goal: Patients Stated Pain Goal: 3 (07/26/16 0557)               Cecile HearingStephen Edward Turk

## 2016-07-26 NOTE — Anesthesia Procedure Notes (Signed)
Spinal  Patient location during procedure: OR Start time: 07/26/2016 7:42 AM End time: 07/26/2016 7:45 AM Staffing Anesthesiologist: Cecile HearingURK, Vedanshi Massaro EDWARD Performed: anesthesiologist  Preanesthetic Checklist Completed: patient identified, surgical consent, pre-op evaluation, timeout performed, IV checked, risks and benefits discussed and monitors and equipment checked Spinal Block Patient position: sitting Prep: site prepped and draped and DuraPrep Patient monitoring: continuous pulse ox and blood pressure Approach: midline Location: L3-4 Injection technique: single-shot Needle Needle type: Pencan  Needle gauge: 25 G Needle length: 9 cm Assessment Sensory level: T4 Additional Notes Functioning IV was confirmed and monitors were applied. Sterile prep and drape, including hand hygiene, mask and sterile gloves were used. The patient was positioned and the spine was prepped. The skin was anesthetized with lidocaine.  Free flow of clear CSF was obtained prior to injecting local anesthetic into the CSF.  The spinal needle aspirated freely following injection.  The needle was carefully withdrawn.  The patient tolerated the procedure well. Consent was obtained prior to procedure with all questions answered and concerns addressed. Risks including but not limited to bleeding, infection, nerve damage, paralysis, failed block, inadequate analgesia, allergic reaction, high spinal, itching and headache were discussed and the patient wished to proceed.   Arrie AranStephen Helmer Dull, MD

## 2016-07-26 NOTE — Lactation Note (Signed)
This note was copied from a baby's chart. Lactation Consultation Note initial visit at 13 hours of age.  Mom reports several good feedings and denies pain with latch.  Mom is pleased this baby is starting better than her older child did who needed a NS for 1st 2 weeks.  Mom denies concerns.  Baby asleep in crib. [facility] resources given and discussed.  Encouraged to feed with early cues on demand.  Early newborn behavior discussed.  Hand expression demonstrated with colostrum visible. Mom with semi flat nipples and compressible breasts.  Mom to call for assist as needed.    Patient Name: [name] [id number]'UToday's Date: 07/26/2016 Reason for consult: Initial assessment   Maternal Data Has patient been taught Hand Expression?: Yes Does the patient have breastfeeding experience prior to this delivery?: Yes  Feeding Feeding Type: Breast Fed  LATCH Score/Interventions Latch: Grasps breast easily, tongue down, lips flanged, rhythmical sucking.  Audible Swallowing: A few with stimulation Intervention(s): Skin to skin;Hand expression  Type of Nipple: Everted at rest and after stimulation  Comfort (Breast/Nipple): Soft / non-tender     Hold (Positioning): Assistance needed to correctly position infant at breast and maintain latch. Intervention(s): Breastfeeding basics reviewed  LATCH Score: 8  Lactation Tools Discussed/Used     Consult Status Consult Status: Follow-up Date: 07/27/16 Follow-up type: In-patient    Jannifer RodneyShoptaw, Jana Lynn 07/26/2016, 9:49 PM

## 2016-07-27 LAB — BIRTH TISSUE RECOVERY COLLECTION (PLACENTA DONATION)

## 2016-07-27 LAB — CBC
HEMATOCRIT: 29.1 % — AB (ref 36.0–46.0)
HEMOGLOBIN: 9.8 g/dL — AB (ref 12.0–15.0)
MCH: 27.1 pg (ref 26.0–34.0)
MCHC: 33.7 g/dL (ref 30.0–36.0)
MCV: 80.6 fL (ref 78.0–100.0)
Platelets: 228 10*3/uL (ref 150–400)
RBC: 3.61 MIL/uL — ABNORMAL LOW (ref 3.87–5.11)
RDW: 13.3 % (ref 11.5–15.5)
WBC: 11.5 10*3/uL — AB (ref 4.0–10.5)

## 2016-07-27 NOTE — Progress Notes (Signed)
Patient counseled for circ including risk of bleeding, infection, and scarring.  All questions were answered and the patient wishes to proceed.   Shandiin Eisenbeis, DO 

## 2016-07-27 NOTE — Progress Notes (Signed)
Subjective: Postpartum Day 1: Cesarean Delivery Patient reports tolerating PO, + flatus and no problems voiding.    Objective: Vital signs in last 24 hours: Temp:  [97.2 F (36.2 C)-98.3 F (36.8 C)] 98 F (36.7 C) (11/30 0628) Pulse Rate:  [70-89] 77 (11/30 0628) Resp:  [12-37] 18 (11/30 0628) BP: (100-141)/(50-121) 118/73 (11/30 0628) SpO2:  [95 %-100 %] 95 % (11/30 0230)  Physical Exam:  General: alert and cooperative Lochia: appropriate Uterine Fundus: firm Incision: healing well DVT Evaluation: No evidence of DVT seen on physical exam. Negative Homan's sign. No cords or calf tenderness. No significant calf/ankle edema.   Recent Labs  07/24/16 1212 07/27/16 0525  HGB 11.2* 9.8*  HCT 33.3* 29.1*    Assessment/Plan: Status post Cesarean section. Doing well postoperatively.  Continue current care. Desires circ prior to discharge CURTIS,CAROL G 07/27/2016, 8:24 AM

## 2016-07-28 ENCOUNTER — Encounter (HOSPITAL_COMMUNITY): Payer: Self-pay | Admitting: Obstetrics & Gynecology

## 2016-07-28 LAB — TYPE AND SCREEN
ABO/RH(D): A POS
ANTIBODY SCREEN: NEGATIVE
UNIT DIVISION: 0
Unit division: 0

## 2016-07-28 MED ORDER — OXYCODONE-ACETAMINOPHEN 5-325 MG PO TABS: 1.0000 | ORAL_TABLET | ORAL | 0 refills | Status: DC | PRN

## 2016-07-28 MED ORDER — IBUPROFEN 600 MG PO TABS: 600.0000 mg | ORAL_TABLET | Freq: Four times a day (QID) | ORAL | 1 refills | Status: DC

## 2016-07-28 NOTE — Lactation Note (Signed)
This note was copied from a baby's chart. Lactation Consultation Note  Patient Name: [name] [id number]'BToday's Date: 07/28/2016 Reason for consult: Follow-up assessment Mom latching baby independently. Baby demonstrating good suckling bursts with swallows noted. Mom reports she feels her milk is transitioning. Baby has been to breast 10 times in 24 hours on average 15-20 minutes. Mom reports baby is starting to take both breasts with some feedings. Baby has had 6 voids/3 stools in past 24 hours. Weight loss approx 8%. Mom will keep feeding baby with feeding ques but at least 8-12 times or more in 24 hours, both breasts most feedings. Mom discussed post pumping 4 times/day and giving baby back any amount of EBM she receives to stabilize weight loss. Reviewed how to use curved tipped syringe or 5 fr feeding tube/syringe to supplement. Reassured Mom that feedings look good and output good, keep baby at breast as often as he wants to nurse, but reasons for concern are not waking to BF, missing 2 or more feedings in a row, or wanting to be at breast constantly. These would be reasons to notify Peds.  Engorgement care reviewed if needed, Mom requested OP f/u next week for feeding assessment. Appointment scheduled for Wednesday 08/02/16 at 2:30. Peds f/u Monday. Advised of support group. Encouraged to call for questions/concerns.   Maternal Data    Feeding Feeding Type: Breast Fed Length of feed: 35 min  LATCH Score/Interventions Latch: Grasps breast easily, tongue down, lips flanged, rhythmical sucking.  Audible Swallowing: A few with stimulation  Type of Nipple: Everted at rest and after stimulation  Comfort (Breast/Nipple): Soft / non-tender     Hold (Positioning): No assistance needed to correctly position infant at breast. Intervention(s): Support Pillows;Breastfeeding basics reviewed;Position options;Skin to skin  LATCH Score: 9  Lactation Tools Discussed/Used     Consult  Status Consult Status: Complete Date: 07/28/16 Follow-up type: In-patient    Alfred LevinsGranger, Lelah Rennaker Ann 07/28/2016, 10:56 AM

## 2016-07-28 NOTE — Discharge Summary (Signed)
Obstetric Discharge Summary Reason for Admission: cesarean section Prenatal Procedures: ultrasound Intrapartum Procedures: cesarean: low cervical, transverse Postpartum Procedures: none Complications-Operative and Postpartum: none Hemoglobin  Date Value Ref Range Status  07/27/2016 9.8 (L) 12.0 - 15.0 g/dL Final   HCT  Date Value Ref Range Status  07/27/2016 29.1 (L) 36.0 - 46.0 % Final    Physical Exam:  General: alert and cooperative Lochia: appropriate Uterine Fundus: firm Incision: healing well DVT Evaluation: No evidence of DVT seen on physical exam. Negative Homan's sign. No cords or calf tenderness. No significant calf/ankle edema.  Discharge Diagnoses: Term Pregnancy-delivered  Discharge Information: Date: 07/28/2016 Activity: pelvic rest Diet: routine Medications: PNV, Ibuprofen and Percocet Condition: stable Instructions: refer to practice specific booklet Discharge to: home   Newborn Data: Live born female  Birth Weight: 10 lb 1 oz (4565 g) APGAR: 9, 9  Home with mother.  CURTIS,CAROL G 07/28/2016, 9:25 AM

## 2016-08-02 ENCOUNTER — Ambulatory Visit (HOSPITAL_COMMUNITY)
Admit: 2016-08-02 | Discharge: 2016-08-02 | Disposition: A | Discharge status: Home / Self Care | Payer: BC Managed Care – PPO | Attending: Obstetrics & Gynecology | Admitting: Obstetrics & Gynecology

## 2016-08-02 NOTE — Lactation Note (Addendum)
Lactation Consult  Mother's reason for visit:  Feeding assessment, weight loss in hospital Visit Type:  outpatient Appointment Notes:  Infant is jaundiced with yellow sclera.  [facility] and call her Pediatrician.  Baby latches easily.  Frequent sucks and swallows observed for 35 min.  Baby transferred approx 70 ml.  Due to infant's weight loss and jaundice, suggest mother should either be sure feedings or longer (30-35 min) or post pump 2-3 times a day and give baby back volume pumped at next feeding. Suggest mother come in for weight check at support group next Monday.  Consult:  Initial Lactation Consultant:  Hardie PulleyBerkelhammer, Ruth Boschen Baby's Name:  Vaughan BrownerBenjamin Ross Macho Date of Birth:  07/26/2015 Pediatrician:  Phillips OdorGolding Gender:  female Gestational Age: 2830w2d (At Birth) Birth Weight:  10 lb 1 oz (4565 g) Weight at Discharge:  Weight: 9 lb 4.7 oz (4215 g)               Date of Discharge:  07/28/2016      Filed Weights   07/26/16 0809 07/26/16 2300 07/28/16 0043  Weight: (!) 10 lb 1 oz (4565 g) (!) 9 lb 13.5 oz (4465 g) 9 lb 4.7 oz (4215 g)  [facility]  9 lb 12 oz     Location:Pediatrician's office Weight today:  9 lb 4.8 before feeding 9 lb 7.3 oz after feeding. _______________________________________________________________________ ________________________________________________________________________  Mother's Name: Betsy CoderJessica Hinzman Type of delivery:   Breastfeeding Experience:  P2 Maternal Medical Conditions:  Pregnancy induced hypertension/resoloved per mother Maternal Medications:  PNV, motrin ________________________________________________________________________  Breastfeeding History (Post Discharge)  Frequency of breastfeeding:  q2-3 hhours Duration of feeding:  16-30 min  Patient does not supplement or pump.  Infant Intake and Output Assessment  Voids:  8-10 in 24 hrs.  Color:   Clear yellow Stools:  3-6 in 24 hrs.  Color:  Green and Yellow  ________________________________________________________________________  Maternal Breast Assessment  Breast:  Soft Nipple:  Erect Pain level:  0 _______________________________________________________________________ Feeding Assessment/Evaluation  Initial feeding assessment:  Infant's oral assessment:  Variance  Positioning:  Cross cradle Left breast  LATCH documentation:  Latch:  2 = Grasps breast easily, tongue down, lips flanged, rhythmical sucking.  Audible swallowing:  1 = A few with stimulation  Type of nipple:  2 = Everted at rest and after stimulation  Comfort (Breast/Nipple):  2 = Soft / non-tender  Hold (Positioning):  2 = No assistance needed to correctly position infant at breast  LATCH score:  9  Attached assessment:  Deep  Lips flanged:  Yes.    Lips untucked:  Yes.    Suck assessment:  Displays both   Pre-feed weight:  4220 g  (9 lb 4.8 oz.) Post-feed weight:  4290 g 9  lb. 7.3 oz.) Amount transferred:  70 ml  Total amount transferred:  70 ml

## 2018-08-28 HISTORY — PX: BILATERAL SALPINGECTOMY: SHX5743

## 2018-10-04 LAB — OB RESULTS CONSOLE GC/CHLAMYDIA
Chlamydia: NEGATIVE
Gonorrhea: NEGATIVE

## 2018-10-04 LAB — OB RESULTS CONSOLE HIV ANTIBODY (ROUTINE TESTING): HIV: NONREACTIVE

## 2018-10-04 LAB — OB RESULTS CONSOLE RUBELLA ANTIBODY, IGM: Rubella: IMMUNE

## 2018-10-04 LAB — OB RESULTS CONSOLE ABO/RH: RH Type: POSITIVE

## 2018-10-04 LAB — OB RESULTS CONSOLE HEPATITIS B SURFACE ANTIGEN: Hepatitis B Surface Ag: NEGATIVE

## 2018-10-04 LAB — OB RESULTS CONSOLE ANTIBODY SCREEN: Antibody Screen: NEGATIVE

## 2018-10-04 LAB — OB RESULTS CONSOLE RPR: RPR: NONREACTIVE

## 2019-04-03 ENCOUNTER — Encounter (HOSPITAL_COMMUNITY): Payer: Self-pay | Admitting: *Deleted

## 2019-04-03 NOTE — Patient Instructions (Signed)
[name]  04/03/2019   Your procedure is scheduled on:  04/15/2019  Arrive at 0800 at Entrance C on [address] at [facility]  and [address]. You are invited to use the FREE valet parking or use the Visitor's parking deck.  Pick up the phone at the desk and dial [phone].  Call this number if you have problems the morning of surgery: [phone]  Remember:   Do not eat food:(After Midnight) Desps de medianoche.  Do not drink clear liquids: (After Midnight) Desps de medianoche.  Take these medicines the morning of surgery with A SIP OF WATER:  none   Do not wear jewelry, make-up or nail polish.  Do not wear lotions, powders, or perfumes. Do not wear deodorant.  Do not shave 48 hours prior to surgery.  Do not bring valuables to the hospital.  [facility] is not   responsible for any belongings or valuables brought to the hospital.  Contacts, dentures or bridgework may not be worn into surgery.  Leave suitcase in the car. After surgery it may be brought to your room.  For patients admitted to the hospital, checkout time is 11:00 AM the day of              discharge.      Please read over the following fact sheets that you were given:     Preparing for Surgery

## 2019-04-04 ENCOUNTER — Other Ambulatory Visit: Payer: Self-pay

## 2019-04-04 ENCOUNTER — Inpatient Hospital Stay (HOSPITAL_COMMUNITY)
Admission: AD | Admit: 2019-04-04 | Discharge: 2019-04-04 | Disposition: A | Discharge status: Home / Self Care | Payer: BC Managed Care – PPO | Attending: Obstetrics and Gynecology | Admitting: Obstetrics and Gynecology

## 2019-04-04 ENCOUNTER — Encounter (HOSPITAL_COMMUNITY): Payer: Self-pay

## 2019-04-04 DIAGNOSIS — O10919 Unspecified pre-existing hypertension complicating pregnancy, unspecified trimester: Secondary | ICD-10-CM

## 2019-04-04 DIAGNOSIS — Z3A35 35 weeks gestation of pregnancy: Secondary | ICD-10-CM | POA: Diagnosis not present

## 2019-04-04 DIAGNOSIS — O10013 Pre-existing essential hypertension complicating pregnancy, third trimester: Secondary | ICD-10-CM | POA: Diagnosis not present

## 2019-04-04 DIAGNOSIS — R519 Headache, unspecified: Secondary | ICD-10-CM

## 2019-04-04 HISTORY — DX: Headache, unspecified: R51.9

## 2019-04-04 LAB — COMPREHENSIVE METABOLIC PANEL
ALT: 24 U/L (ref 0–44)
AST: 18 U/L (ref 15–41)
Albumin: 2.9 g/dL — ABNORMAL LOW (ref 3.5–5.0)
Alkaline Phosphatase: 197 U/L — ABNORMAL HIGH (ref 38–126)
Anion gap: 13 (ref 5–15)
BUN: <5 mg/dL — ABNORMAL LOW (ref 6–20)
CO2: 21 mmol/L — ABNORMAL LOW (ref 22–32)
Calcium: 9.2 mg/dL (ref 8.9–10.3)
Chloride: 101 mmol/L (ref 98–111)
Creatinine, Ser: 0.58 mg/dL (ref 0.44–1.00)
GFR calc Af Amer: >60 mL/min (ref >60)
GFR calc non Af Amer: >60 mL/min (ref >60)
Glucose, Bld: 92 mg/dL (ref 70–99)
Potassium: 3.9 mmol/L (ref 3.5–5.1)
Sodium: 135 mmol/L (ref 135–145)
Total Bilirubin: 0.7 mg/dL (ref 0.3–1.2)
Total Protein: 7.1 g/dL (ref 6.5–8.1)

## 2019-04-04 LAB — URINALYSIS, ROUTINE W REFLEX MICROSCOPIC
Bilirubin Urine: NEGATIVE
Glucose, UA: NEGATIVE mg/dL
Hgb urine dipstick: NEGATIVE
Ketones, ur: NEGATIVE mg/dL
Leukocytes,Ua: NEGATIVE
Nitrite: NEGATIVE
Protein, ur: NEGATIVE mg/dL
Specific Gravity, Urine: 1.008 (ref 1.005–1.030)
pH: 7 (ref 5.0–8.0)

## 2019-04-04 LAB — CBC
HCT: 33.1 % — ABNORMAL LOW (ref 36.0–46.0)
Hemoglobin: 11 g/dL — ABNORMAL LOW (ref 12.0–15.0)
MCH: 27.6 pg (ref 26.0–34.0)
MCHC: 33.2 g/dL (ref 30.0–36.0)
MCV: 83 fL (ref 80.0–100.0)
Platelets: 273 10*3/uL (ref 150–400)
RBC: 3.99 MIL/uL (ref 3.87–5.11)
RDW: 12.6 % (ref 11.5–15.5)
WBC: 13.5 10*3/uL — ABNORMAL HIGH (ref 4.0–10.5)
nRBC: 0 % (ref 0.0–0.2)

## 2019-04-04 LAB — PROTEIN / CREATININE RATIO, URINE
Creatinine, Urine: 64.58 mg/dL
Protein Creatinine Ratio: 0.12 mg/mg{Cre} (ref 0.00–0.15)
Total Protein, Urine: 8 mg/dL

## 2019-04-04 MED ORDER — DIPHENHYDRAMINE HCL 50 MG/ML IJ SOLN
12.5000 mg | Freq: Once | INTRAMUSCULAR | Status: AC
  Administered 2019-04-04: 12.5 mg via INTRAVENOUS
  Filled 2019-04-04: qty 1

## 2019-04-04 MED ORDER — METOCLOPRAMIDE HCL 5 MG/ML IJ SOLN
10.0000 mg | Freq: Once | INTRAMUSCULAR | Status: AC
  Administered 2019-04-04: 10 mg via INTRAVENOUS
  Filled 2019-04-04: qty 2

## 2019-04-04 MED ORDER — DEXAMETHASONE SODIUM PHOSPHATE 10 MG/ML IJ SOLN
10.0000 mg | Freq: Once | INTRAMUSCULAR | Status: AC
  Administered 2019-04-04: 15:00:00 10 mg via INTRAVENOUS
  Filled 2019-04-04: qty 1

## 2019-04-04 MED ORDER — LACTATED RINGERS IV BOLUS (SEPSIS)
1000.0000 mL | Freq: Once | INTRAVENOUS | Status: AC
  Administered 2019-04-04: 1000 mL via INTRAVENOUS

## 2019-04-04 NOTE — MAU Note (Signed)
Pt had Prenatal visit today. Failed NST but passed BPP.  Has had headache. At provider 177/88 was highest. No protein in urine. No swelling. Saw some spots yesterday.  Denies LOF or VB. Has decreased fetal movement since Wednesday evening. Is repeat c/s on 8/18. First C-sect d/t fetal distress.

## 2019-04-04 NOTE — Discharge Instructions (Signed)
Hypertension During Pregnancy °Hypertension is also called high blood pressure. High blood pressure means that the force of your blood moving in your body is too strong. It can cause problems for you and your baby. Different types of high blood pressure can happen during pregnancy. The types are: °· High blood pressure before you got pregnant. This is called chronic hypertension.  This can continue during your pregnancy. Your doctor will want to keep checking your blood pressure. You may need medicine to keep your blood pressure under control while you are pregnant. You will need follow-up visits after you have your baby. °· High blood pressure that goes up during pregnancy when it was normal before. This is called gestational hypertension. It will usually get better after you have your baby, but your doctor will need to watch your blood pressure to make sure that it is getting better. °· Very high blood pressure during pregnancy. This is called preeclampsia. Very high blood pressure is an emergency that needs to be checked and treated right away. °· You may develop very high blood pressure after giving birth. This is called postpartum preeclampsia. This usually occurs within 48 hours after childbirth but may occur up to 6 weeks after giving birth. This is rare. °How does this affect me? °If you have high blood pressure during pregnancy, you have a higher chance of developing high blood pressure: °· As you get older. °· If you get pregnant again. °In some cases, high blood pressure during pregnancy can cause: °· Stroke. °· Heart attack. °· Damage to the kidneys, lungs, or liver. °· Preeclampsia. °· Jerky movements you cannot control (convulsions or seizures). °· Problems with the placenta. °How does this affect my baby? °Your baby may: °· Be born early. °· Not weigh as much as he or she should. °· Not handle labor well, leading to a c-section birth. °What are the risks? °· Having high blood pressure during a past  pregnancy. °· Being overweight. °· Being 32 years old or older. °· Being pregnant for the first time. °· Being pregnant with more than one baby. °· Becoming pregnant using fertility methods, such as IVF. °· Having other problems, such as diabetes, or kidney disease. °· Having family members who have high blood pressure. °What can I do to lower my risk? ° °· Keep a healthy weight. °· Eat a healthy diet. °· Follow what your doctor tells you about treating any medical problems that you had before becoming pregnant. °It is very important to go to all of your doctor visits. Your doctor will check your blood pressure and make sure that your pregnancy is progressing as it should. Treatment should start early if a problem is found. °How is this treated? °Treatment for high blood pressure during pregnancy can differ depending on the type of high blood pressure you have and how serious it is. °· You may need to take blood pressure medicine. °· If you have been taking medicine for your blood pressure, you may need to change the medicine during pregnancy if it is not safe for your baby. °· If your doctor thinks that you could get very high blood pressure, he or she may tell you to take a low-dose aspirin during your pregnancy. °· If you have very high blood pressure, you may need to stay in the hospital so you and your baby can be watched closely. You may also need to take medicine to lower your blood pressure. This medicine may be given by mouth   or through an IV tube. °· In some cases, if your condition gets worse, you may need to have your baby early. °Follow these instructions at home: °Eating and drinking ° °· Drink enough fluid to keep your pee (urine) pale yellow. °· Avoid caffeine. °Lifestyle °· Do not use any products that contain nicotine or tobacco, such as cigarettes, e-cigarettes, and chewing tobacco. If you need help quitting, ask your doctor. °· Do not use alcohol or drugs. °· Avoid stress. °· Rest and get plenty  of sleep. °· Regular exercise can help. Ask your doctor what kinds of exercise are best for you. °General instructions °· Take over-the-counter and prescription medicines only as told by your doctor. °· Keep all prenatal and follow-up visits as told by your doctor. This is important. °Contact a doctor if: °· You have symptoms that your doctor told you to watch for, such as: °? Headaches. °? Nausea. °? Vomiting. °? Belly (abdominal) pain. °? Dizziness. °? Light-headedness. °Get help right away if: °· You have: °? Very bad belly pain that does not get better with treatment. °? A very bad headache that does not get better. °? Vomiting that does not get better. °? Sudden, fast weight gain. °? Sudden swelling in your hands, ankles, or face. °? Bleeding from your vagina. °? Blood in your pee. °? Blurry vision. °? Double vision. °? Shortness of breath. °? Chest pain. °? Weakness on one side of your body. °? Trouble talking. °· Your baby is not moving as much as usual. °Summary °· High blood pressure is also called hypertension. °· High blood pressure means that the force of your blood moving in your body is too strong. °· High blood pressure can cause problems for you and your baby. °· Keep all follow-up visits as told by your doctor. This is important. °This information is not intended to replace advice given to you by your health care provider. Make sure you discuss any questions you have with your health care provider. °Document Released: 09/16/2010 Document Revised: 12/05/2018 Document Reviewed: 09/10/2018 °Elsevier Patient Education © 2020 [employer] ° °

## 2019-04-04 NOTE — MAU Provider Note (Addendum)
History     CSN: [id number]  Arrival date and time: 04/04/19 1203   First Provider Initiated Contact with Patient 04/04/19 1344      Chief Complaint  Patient presents with  . Hypertension   HPI   [name] is a 33 y.o. female G58P2002 @ [redacted]w[redacted]d with a history of migraines with this pregnancy and CHTN here with elevated BP. She developed a HA last night around 0100 which woke her from her sleep. She checked her BP at that time and it was 147/96 she took tylenol which did not help. At 0500 she checked her BP again and it read 156/88, she then took 1 fioricet which helped only minimally. She has had some upper right upper quadrant pain for the last few days. No nausea or vomiting. She feels the pain is fetal movement or position. The pain comes and goes and is sporadic. No scotoma currently.   OB History    Gravida  3   Para  2   Term  2   Preterm      AB      Living  2     SAB      TAB      Ectopic      Multiple  0   Live Births  2           Past Medical History:  Diagnosis Date  . Asthma    as a child; no longer needs an inhaler  . Headache 04/04/2019   migraines once a month with this pregnancy only  . PONV (postoperative nausea and vomiting)    vomited with last c section    Past Surgical History:  Procedure Laterality Date  . CESAREAN SECTION N/A 06/20/2014   Procedure: CESAREAN SECTION;  Surgeon: Linda Hedges, DO;  Location: [facility];  Service: Obstetrics;  Laterality: N/A;  . CESAREAN SECTION N/A 07/26/2016   Procedure: CESAREAN SECTION;  Surgeon: Linda Hedges, DO;  Location: [facility];  Service: Obstetrics;  Laterality: N/A;  Repeat edc 08/07/16 nkda request RNFA  . TONSILLECTOMY AND ADENOIDECTOMY    . WRIST GANGLION EXCISION     left wrist    Family History  Problem Relation Age of Onset  . Cancer Mother        gallbladder  . Cancer Maternal Aunt        breast  . Thyroid disease Maternal Aunt   . Thyroid disease Paternal  Aunt     Social History   Tobacco Use  . Smoking status: Never Smoker  . Smokeless tobacco: Never Used  Substance Use Topics  . Alcohol use: No  . Drug use: No    Allergies: No Known Allergies  Medications Prior to Admission  Medication Sig Dispense Refill Last Dose  . ibuprofen (ADVIL,MOTRIN) 600 MG tablet Take 1 tablet (600 mg total) by mouth every 6 (six) hours. 30 tablet 1   . oxyCODONE-acetaminophen (PERCOCET/ROXICET) 5-325 MG tablet Take 1 tablet by mouth every 4 (four) hours as needed (pain scale 4-7). 30 tablet 0   . Prenatal Vit-Fe Fumarate-FA (PRENATAL MULTIVITAMIN) TABS tablet Take 1 tablet by mouth daily at 12 noon.      Results for orders placed or performed during the hospital encounter of 04/04/19 (from the past 48 hour(s))  Urinalysis, Routine w reflex microscopic     Status: None   Collection Time: 04/04/19 12:35 PM  Result Value Ref Range   Color, Urine YELLOW YELLOW   APPearance CLEAR CLEAR  Specific Gravity, Urine 1.008 1.005 - 1.030   pH 7.0 5.0 - 8.0   Glucose, UA NEGATIVE NEGATIVE mg/dL   Hgb urine dipstick NEGATIVE NEGATIVE   Bilirubin Urine NEGATIVE NEGATIVE   Ketones, ur NEGATIVE NEGATIVE mg/dL   Protein, ur NEGATIVE NEGATIVE mg/dL   Nitrite NEGATIVE NEGATIVE   Leukocytes,Ua NEGATIVE NEGATIVE    Comment: Performed at [facility], [address] [place], [place] [address]  Protein / creatinine ratio, urine     Status: None   Collection Time: 04/04/19 12:35 PM  Result Value Ref Range   Creatinine, Urine 64.58 mg/dL   Total Protein, Urine 8 mg/dL    Comment: NO NORMAL RANGE ESTABLISHED FOR THIS TEST   Protein Creatinine Ratio 0.12 0.00 - 0.15 mg/mg[Cre]    Comment: Performed at [facility], [address] [place], [place] [address]  CBC     Status: Abnormal   Collection Time: 04/04/19  1:48 PM  Result Value Ref Range   WBC 13.5 (H) 4.0 - 10.5 K/uL   RBC 3.99 3.87 - 5.11 MIL/uL   Hemoglobin 11.0 (L) 12.0 - 15.0 g/dL   HCT 19.133.1  (L) 47.836.0 - 46.0 %   MCV 83.0 80.0 - 100.0 fL   MCH 27.6 26.0 - 34.0 pg   MCHC 33.2 30.0 - 36.0 g/dL   RDW 29.512.6 62.111.5 - 30.815.5 %   Platelets 273 150 - 400 K/uL   nRBC 0.0 0.0 - 0.2 %    Comment: Performed at [facility], [address] [place], [place] [address]  Comprehensive metabolic panel     Status: Abnormal   Collection Time: 04/04/19  1:48 PM  Result Value Ref Range   Sodium 135 135 - 145 mmol/L   Potassium 3.9 3.5 - 5.1 mmol/L   Chloride 101 98 - 111 mmol/L   CO2 21 (L) 22 - 32 mmol/L   Glucose, Bld 92 70 - 99 mg/dL   BUN <5 (L) 6 - 20 mg/dL   Creatinine, Ser 6.960.58 0.44 - 1.00 mg/dL   Calcium 9.2 8.9 - 29.510.3 mg/dL   Total Protein 7.1 6.5 - 8.1 g/dL   Albumin 2.9 (L) 3.5 - 5.0 g/dL   AST 18 15 - 41 U/L   ALT 24 0 - 44 U/L   Alkaline Phosphatase 197 (H) 38 - 126 U/L   Total Bilirubin 0.7 0.3 - 1.2 mg/dL   GFR calc non Af Amer >60 >60 mL/min   GFR calc Af Amer >60 >60 mL/min   Anion gap 13 5 - 15    Comment: Performed at [facility], [address] [place], [place] [address]   Review of Systems  Eyes: Positive for photophobia and visual disturbance (Saw spots today while having BP check. Only 1x).  Gastrointestinal: Positive for abdominal pain (RUQ that started yesterday. It comes and goes). Negative for nausea and vomiting.   Physical Exam   Blood pressure 126/78, pulse (!) 109, temperature 97.9 F (36.6 C), temperature source Oral, resp. rate 16, last menstrual period 07/28/2018, SpO2 98 %, unknown if currently breastfeeding.  Patient Vitals for the past 24 hrs:  BP Temp Temp src Pulse Resp SpO2  04/04/19 1332 126/78 - - (!) 109 - -  04/04/19 1316 135/82 - - 93 - -  04/04/19 1301 131/76 - - 98 - -  04/04/19 1250 134/76 - - 94 - -  04/04/19 1225 (!) 148/76 97.9 F (36.6 C) Oral (!) 108  16 98 %     Physical Exam  Constitutional: She is oriented to person, place, and time. She appears well-developed and well-nourished. No distress.  HENT:  Head:  Normocephalic.  Eyes: Pupils are equal, round, and reactive to light.  GI: Soft. She exhibits no distension. There is no abdominal tenderness. There is no rebound.  Musculoskeletal: Normal range of motion.  Neurological: She is alert and oriented to person, place, and time. She displays normal reflexes.  Negative clonus   Skin: Skin is warm. She is not diaphoretic.  Psychiatric: Her behavior is normal.   Fetal Tracing: Baseline: 130 bpm Variability: moderate  Accelerations: 15x15 Decelerations: None Toco: Occasional   MAU Course  Procedures  None  MDM   PIH labs WNL PCR negative  Ha 5/10- HA cocktail given Headache down to 1/10, patient requesting to eat.  Discussed patient with Dr. Vergie LivingPickens, patient has close f/u in the office on Tuesday and has a BP cuff at home.   Assessment and Plan   A:  1. Chronic hypertension affecting pregnancy   2. [redacted] weeks gestation of pregnancy     P:  Discharge home in stable condition Pre E precautions Keep your appointment in the office on Tuesday Return if symptoms worsen   Veronnica Hennings, Harolyn RutherfordJennifer I, NP 04/04/2019 3:46 PM

## 2019-04-11 NOTE — H&P (Signed)
[name] is a 32 y.o. female G3P2002 at 27 weeks presenting for repeat C/S (previous x 2) and salpingectomy with GHTN.  Antepartum course complicated by h/o GHTN for which she has taken daily low dose ASA.  She has experienced monthly migraines this pregnancy.    OB History    Gravida  3   Para  2   Term  2   Preterm      AB      Living  2     SAB      TAB      Ectopic      Multiple  0   Live Births  2          Past Medical History:  Diagnosis Date  . Asthma    as a child; no longer needs an inhaler  . Headache 04/04/2019   migraines once a month with this pregnancy only  . PONV (postoperative nausea and vomiting)    vomited with last c section   Past Surgical History:  Procedure Laterality Date  . CESAREAN SECTION N/A 06/20/2014   Procedure: CESAREAN SECTION;  Surgeon: Linda Hedges, DO;  Location: [facility];  Service: Obstetrics;  Laterality: N/A;  . CESAREAN SECTION N/A 07/26/2016   Procedure: CESAREAN SECTION;  Surgeon: Linda Hedges, DO;  Location: [facility];  Service: Obstetrics;  Laterality: N/A;  Repeat edc 08/07/16 nkda request RNFA  . TONSILLECTOMY AND ADENOIDECTOMY    . WRIST GANGLION EXCISION     left wrist   Family History: family history includes Cancer in her maternal aunt and mother; Thyroid disease in her maternal aunt and paternal aunt. Social History:  reports that she has never smoked. She has never used smokeless tobacco. She reports that she does not drink alcohol or use drugs.     Maternal Diabetes: No Genetic Screening: Normal Maternal Ultrasounds/Referrals: Normal Fetal Ultrasounds or other Referrals:  None Maternal Substance Abuse:  No Significant Maternal Medications:  None Significant Maternal Lab Results:  None Other Comments:  None  ROS Maternal Medical History:  Prenatal complications: PIH.   Prenatal Complications - Diabetes: none.      Last menstrual period 07/28/2018, unknown if currently  breastfeeding. Maternal Exam:  Uterine Assessment: Contraction strength is mild.  Contraction frequency is rare.   Abdomen: Patient reports no abdominal tenderness. Surgical scars: low transverse.   Fundal height is c/w dates.   Estimated fetal weight is 8#.       Physical Exam  Constitutional: She is oriented to person, place, and time. She appears well-developed and well-nourished.  GI: Soft. There is no rebound and no guarding.  Neurological: She is alert and oriented to person, place, and time.  Skin: Skin is warm and dry.  Psychiatric: She has a normal mood and affect. Her behavior is normal.    Prenatal labs: ABO, Rh: A/Positive/-- (02/07 0000) Antibody: Negative (02/07 0000) Rubella: Immune (02/07 0000) RPR: Nonreactive (02/07 0000)  HBsAg: Negative (02/07 0000)  HIV: Non-reactive (02/07 0000)  GBS:     Assessment/Plan: 33yo G3P2002 at 37 weeks for repeat C/S and bilateral salpingectomy Patient has been counseled re: risk of bleeding, infection, scarring, and damage to surrounding structures.  She understands the risks of permanence and regret s/p salpingectomy.  All questions were answered and patient wishes to proceed.   Linda Hedges 04/11/2019, 9:18 AM

## 2019-04-13 ENCOUNTER — Other Ambulatory Visit (HOSPITAL_COMMUNITY)
Admission: RE | Admit: 2019-04-13 | Discharge: 2019-04-13 | Disposition: A | Discharge status: Home / Self Care | Payer: BC Managed Care – PPO | Source: Ambulatory Visit | Attending: Obstetrics & Gynecology | Admitting: Obstetrics & Gynecology

## 2019-04-13 ENCOUNTER — Other Ambulatory Visit: Payer: Self-pay

## 2019-04-13 DIAGNOSIS — O139 Gestational [pregnancy-induced] hypertension without significant proteinuria, unspecified trimester: Secondary | ICD-10-CM | POA: Diagnosis not present

## 2019-04-13 DIAGNOSIS — Z20828 Contact with and (suspected) exposure to other viral communicable diseases: Secondary | ICD-10-CM | POA: Insufficient documentation

## 2019-04-13 DIAGNOSIS — Z01812 Encounter for preprocedural laboratory examination: Secondary | ICD-10-CM | POA: Insufficient documentation

## 2019-04-13 LAB — TYPE AND SCREEN
ABO/RH(D): A POS
Antibody Screen: NEGATIVE

## 2019-04-13 LAB — CBC
HCT: 31.6 % — ABNORMAL LOW (ref 36.0–46.0)
Hemoglobin: 10.4 g/dL — ABNORMAL LOW (ref 12.0–15.0)
MCH: 26.8 pg (ref 26.0–34.0)
MCHC: 32.9 g/dL (ref 30.0–36.0)
MCV: 81.4 fL (ref 80.0–100.0)
Platelets: 258 10*3/uL (ref 150–400)
RBC: 3.88 MIL/uL (ref 3.87–5.11)
RDW: 12.8 % (ref 11.5–15.5)
WBC: 11.3 10*3/uL — ABNORMAL HIGH (ref 4.0–10.5)
nRBC: 0.2 % (ref 0.0–0.2)

## 2019-04-13 LAB — SARS CORONAVIRUS 2 (TAT 6-24 HRS): SARS Coronavirus 2: NEGATIVE

## 2019-04-13 LAB — ABO/RH: ABO/RH(D): A POS

## 2019-04-13 NOTE — MAU Note (Signed)
Pt here for PAT covid swab and lab draw. Denies any symptoms. Swab collected. Informed pt to leave on blood bank bracelet, ppt verbalizes understanding

## 2019-04-15 ENCOUNTER — Inpatient Hospital Stay (HOSPITAL_COMMUNITY): Payer: BC Managed Care – PPO | Admitting: Anesthesiology

## 2019-04-15 ENCOUNTER — Inpatient Hospital Stay (HOSPITAL_COMMUNITY)
Admission: RE | Admit: 2019-04-15 | Discharge: 2019-04-17 | DRG: 785 | Disposition: A | Discharge status: Home / Self Care | Payer: BC Managed Care – PPO | Attending: Obstetrics & Gynecology | Admitting: Obstetrics & Gynecology

## 2019-04-15 ENCOUNTER — Other Ambulatory Visit: Payer: Self-pay

## 2019-04-15 ENCOUNTER — Encounter (HOSPITAL_COMMUNITY)
Admission: RE | Disposition: A | Discharge status: Home / Self Care | Payer: Self-pay | Source: Home / Self Care | Attending: Obstetrics & Gynecology

## 2019-04-15 ENCOUNTER — Encounter (HOSPITAL_COMMUNITY): Payer: Self-pay | Admitting: *Deleted

## 2019-04-15 DIAGNOSIS — Z3A37 37 weeks gestation of pregnancy: Secondary | ICD-10-CM | POA: Diagnosis not present

## 2019-04-15 DIAGNOSIS — O134 Gestational [pregnancy-induced] hypertension without significant proteinuria, complicating childbirth: Secondary | ICD-10-CM | POA: Diagnosis present

## 2019-04-15 DIAGNOSIS — O34211 Maternal care for low transverse scar from previous cesarean delivery: Secondary | ICD-10-CM | POA: Diagnosis present

## 2019-04-15 DIAGNOSIS — D649 Anemia, unspecified: Secondary | ICD-10-CM | POA: Diagnosis present

## 2019-04-15 DIAGNOSIS — Z7982 Long term (current) use of aspirin: Secondary | ICD-10-CM | POA: Diagnosis not present

## 2019-04-15 DIAGNOSIS — Z98891 History of uterine scar from previous surgery: Secondary | ICD-10-CM

## 2019-04-15 DIAGNOSIS — Z302 Encounter for sterilization: Secondary | ICD-10-CM | POA: Diagnosis not present

## 2019-04-15 DIAGNOSIS — O9902 Anemia complicating childbirth: Secondary | ICD-10-CM | POA: Diagnosis present

## 2019-04-15 LAB — RPR: RPR Ser Ql: NONREACTIVE

## 2019-04-15 SURGERY — Surgical Case
Anesthesia: Regional | Laterality: Bilateral

## 2019-04-15 MED ORDER — SENNOSIDES-DOCUSATE SODIUM 8.6-50 MG PO TABS
2.0000 | ORAL_TABLET | ORAL | Status: DC
  Administered 2019-04-15 – 2019-04-16 (×2): 2 via ORAL
  Filled 2019-04-15 (×2): qty 2

## 2019-04-15 MED ORDER — NALBUPHINE HCL 10 MG/ML IJ SOLN: 5.0000 mg | Freq: Once | INTRAMUSCULAR | Status: DC | PRN

## 2019-04-15 MED ORDER — FENTANYL CITRATE (PF) 100 MCG/2ML IJ SOLN: 25.0000 ug | INTRAMUSCULAR | Status: DC | PRN

## 2019-04-15 MED ORDER — ZOLPIDEM TARTRATE 5 MG PO TABS: 5.0000 mg | ORAL_TABLET | Freq: Every evening | ORAL | Status: DC | PRN

## 2019-04-15 MED ORDER — ACETAMINOPHEN 10 MG/ML IV SOLN
INTRAVENOUS | Status: AC
  Filled 2019-04-15: qty 100

## 2019-04-15 MED ORDER — MORPHINE SULFATE (PF) 0.5 MG/ML IJ SOLN
INTRAMUSCULAR | Status: DC | PRN
  Administered 2019-04-15: .15 mg via INTRATHECAL

## 2019-04-15 MED ORDER — OXYCODONE-ACETAMINOPHEN 5-325 MG PO TABS: 1.0000 | ORAL_TABLET | ORAL | Status: DC | PRN

## 2019-04-15 MED ORDER — PHENYLEPHRINE 40 MCG/ML (10ML) SYRINGE FOR IV PUSH (FOR BLOOD PRESSURE SUPPORT)
PREFILLED_SYRINGE | INTRAVENOUS | Status: DC | PRN
  Administered 2019-04-15: 80 ug via INTRAVENOUS
  Administered 2019-04-15: 120 ug via INTRAVENOUS

## 2019-04-15 MED ORDER — DEXTROSE 5 % IV SOLN
INTRAVENOUS | Status: AC
  Filled 2019-04-15: qty 3000

## 2019-04-15 MED ORDER — KETOROLAC TROMETHAMINE 30 MG/ML IJ SOLN: 30.0000 mg | Freq: Four times a day (QID) | INTRAMUSCULAR | Status: AC | PRN

## 2019-04-15 MED ORDER — ONDANSETRON HCL 4 MG/2ML IJ SOLN
INTRAMUSCULAR | Status: DC | PRN
  Administered 2019-04-15: 4 mg via INTRAVENOUS

## 2019-04-15 MED ORDER — NALBUPHINE HCL 10 MG/ML IJ SOLN: 5.0000 mg | INTRAMUSCULAR | Status: DC | PRN

## 2019-04-15 MED ORDER — SIMETHICONE 80 MG PO CHEW
80.0000 mg | CHEWABLE_TABLET | ORAL | Status: DC
  Administered 2019-04-16 (×2): 80 mg via ORAL
  Filled 2019-04-15 (×2): qty 1

## 2019-04-15 MED ORDER — DEXTROSE 5 % IV SOLN
3.0000 g | INTRAVENOUS | Status: AC
  Administered 2019-04-15: 3 g via INTRAVENOUS
  Filled 2019-04-15: qty 3000

## 2019-04-15 MED ORDER — SCOPOLAMINE 1 MG/3DAYS TD PT72
MEDICATED_PATCH | TRANSDERMAL | Status: AC
  Filled 2019-04-15: qty 1

## 2019-04-15 MED ORDER — OXYTOCIN 40 UNITS IN NORMAL SALINE INFUSION - SIMPLE MED: 2.5000 [IU]/h | INTRAVENOUS | Status: AC

## 2019-04-15 MED ORDER — MORPHINE SULFATE (PF) 0.5 MG/ML IJ SOLN
INTRAMUSCULAR | Status: AC
  Filled 2019-04-15: qty 10

## 2019-04-15 MED ORDER — ACETAMINOPHEN 325 MG PO TABS
650.0000 mg | ORAL_TABLET | ORAL | Status: DC | PRN
  Administered 2019-04-16: 650 mg via ORAL
  Filled 2019-04-15: qty 2

## 2019-04-15 MED ORDER — KETOROLAC TROMETHAMINE 30 MG/ML IJ SOLN
INTRAMUSCULAR | Status: AC
  Filled 2019-04-15: qty 1

## 2019-04-15 MED ORDER — DIPHENHYDRAMINE HCL 50 MG/ML IJ SOLN: 12.5000 mg | INTRAMUSCULAR | Status: DC | PRN

## 2019-04-15 MED ORDER — SODIUM CHLORIDE 0.9% FLUSH: 3.0000 mL | INTRAVENOUS | Status: DC | PRN

## 2019-04-15 MED ORDER — SODIUM CHLORIDE 0.9 % IV SOLN
INTRAVENOUS | Status: DC | PRN
  Administered 2019-04-15: 30 ug/min via INTRAVENOUS

## 2019-04-15 MED ORDER — IBUPROFEN 800 MG PO TABS
800.0000 mg | ORAL_TABLET | Freq: Four times a day (QID) | ORAL | Status: DC | PRN
  Administered 2019-04-15 – 2019-04-17 (×6): 800 mg via ORAL
  Filled 2019-04-15 (×6): qty 1

## 2019-04-15 MED ORDER — ACETAMINOPHEN 10 MG/ML IV SOLN
1000.0000 mg | Freq: Once | INTRAVENOUS | Status: DC | PRN
  Administered 2019-04-15: 1000 mg via INTRAVENOUS

## 2019-04-15 MED ORDER — SODIUM CHLORIDE 0.9 % IR SOLN
Status: DC | PRN
  Administered 2019-04-15: 1

## 2019-04-15 MED ORDER — ONDANSETRON HCL 4 MG/2ML IJ SOLN: 4.0000 mg | Freq: Three times a day (TID) | INTRAMUSCULAR | Status: DC | PRN

## 2019-04-15 MED ORDER — MENTHOL 3 MG MT LOZG: 1.0000 | LOZENGE | OROMUCOSAL | Status: DC | PRN

## 2019-04-15 MED ORDER — SCOPOLAMINE 1 MG/3DAYS TD PT72: 1.0000 | MEDICATED_PATCH | Freq: Once | TRANSDERMAL | Status: DC

## 2019-04-15 MED ORDER — LACTATED RINGERS IV SOLN
INTRAVENOUS | Status: DC
  Administered 2019-04-15 (×2): via INTRAVENOUS

## 2019-04-15 MED ORDER — ACETAMINOPHEN 500 MG PO TABS
1000.0000 mg | ORAL_TABLET | Freq: Four times a day (QID) | ORAL | Status: AC
  Administered 2019-04-15 – 2019-04-16 (×4): 1000 mg via ORAL
  Filled 2019-04-15 (×4): qty 2

## 2019-04-15 MED ORDER — NALOXONE HCL 0.4 MG/ML IJ SOLN: 0.4000 mg | INTRAMUSCULAR | Status: DC | PRN

## 2019-04-15 MED ORDER — BUPIVACAINE IN DEXTROSE 0.75-8.25 % IT SOLN
INTRATHECAL | Status: DC | PRN
  Administered 2019-04-15: 2 mg via INTRATHECAL

## 2019-04-15 MED ORDER — DIPHENHYDRAMINE HCL 25 MG PO CAPS: 25.0000 mg | ORAL_CAPSULE | Freq: Four times a day (QID) | ORAL | Status: DC | PRN

## 2019-04-15 MED ORDER — SIMETHICONE 80 MG PO CHEW
80.0000 mg | CHEWABLE_TABLET | Freq: Three times a day (TID) | ORAL | Status: DC
  Administered 2019-04-15 – 2019-04-17 (×7): 80 mg via ORAL
  Filled 2019-04-15 (×7): qty 1

## 2019-04-15 MED ORDER — PHENYLEPHRINE 40 MCG/ML (10ML) SYRINGE FOR IV PUSH (FOR BLOOD PRESSURE SUPPORT): PREFILLED_SYRINGE | INTRAVENOUS | Status: DC | PRN

## 2019-04-15 MED ORDER — SODIUM CHLORIDE 0.9 % IV SOLN
INTRAVENOUS | Status: DC | PRN
  Administered 2019-04-15: 40 [IU] via INTRAVENOUS

## 2019-04-15 MED ORDER — DEXAMETHASONE SODIUM PHOSPHATE 4 MG/ML IJ SOLN
INTRAMUSCULAR | Status: DC | PRN
  Administered 2019-04-15: 8 mg via INTRAVENOUS

## 2019-04-15 MED ORDER — FENTANYL CITRATE (PF) 100 MCG/2ML IJ SOLN
INTRAMUSCULAR | Status: AC
  Filled 2019-04-15: qty 2

## 2019-04-15 MED ORDER — DIPHENHYDRAMINE HCL 25 MG PO CAPS: 25.0000 mg | ORAL_CAPSULE | ORAL | Status: DC | PRN

## 2019-04-15 MED ORDER — ONDANSETRON HCL 4 MG/2ML IJ SOLN
INTRAMUSCULAR | Status: AC
  Filled 2019-04-15: qty 2

## 2019-04-15 MED ORDER — OXYTOCIN 40 UNITS IN NORMAL SALINE INFUSION - SIMPLE MED
INTRAVENOUS | Status: AC
  Filled 2019-04-15: qty 1000

## 2019-04-15 MED ORDER — DEXAMETHASONE SODIUM PHOSPHATE 10 MG/ML IJ SOLN
INTRAMUSCULAR | Status: AC
  Filled 2019-04-15: qty 1

## 2019-04-15 MED ORDER — PRENATAL MULTIVITAMIN CH
1.0000 | ORAL_TABLET | Freq: Every day | ORAL | Status: DC
  Administered 2019-04-16 – 2019-04-17 (×2): 1 via ORAL
  Filled 2019-04-15 (×2): qty 1

## 2019-04-15 MED ORDER — TETANUS-DIPHTH-ACELL PERTUSSIS 5-2.5-18.5 LF-MCG/0.5 IM SUSP: 0.5000 mL | Freq: Once | INTRAMUSCULAR | Status: DC

## 2019-04-15 MED ORDER — SIMETHICONE 80 MG PO CHEW: 80.0000 mg | CHEWABLE_TABLET | ORAL | Status: DC | PRN

## 2019-04-15 MED ORDER — KETOROLAC TROMETHAMINE 30 MG/ML IJ SOLN
30.0000 mg | Freq: Once | INTRAMUSCULAR | Status: AC | PRN
  Administered 2019-04-15: 30 mg via INTRAVENOUS

## 2019-04-15 MED ORDER — FENTANYL CITRATE (PF) 100 MCG/2ML IJ SOLN
INTRAMUSCULAR | Status: DC | PRN
  Administered 2019-04-15: 15 ug via INTRATHECAL

## 2019-04-15 MED ORDER — SCOPOLAMINE 1 MG/3DAYS TD PT72
MEDICATED_PATCH | TRANSDERMAL | Status: DC | PRN
  Administered 2019-04-15: 1 via TRANSDERMAL

## 2019-04-15 MED ORDER — PHENYLEPHRINE HCL-NACL 20-0.9 MG/250ML-% IV SOLN
INTRAVENOUS | Status: AC
  Filled 2019-04-15: qty 500

## 2019-04-15 MED ORDER — LACTATED RINGERS IV SOLN
INTRAVENOUS | Status: DC
  Administered 2019-04-15: 20:00:00 via INTRAVENOUS

## 2019-04-15 MED ORDER — SODIUM CHLORIDE 0.9 % IV SOLN
INTRAVENOUS | Status: DC | PRN
  Administered 2019-04-15: 10:00:00 via INTRAVENOUS

## 2019-04-15 MED ORDER — COCONUT OIL OIL: 1.0000 "application " | TOPICAL_OIL | Status: DC | PRN

## 2019-04-15 MED ORDER — WITCH HAZEL-GLYCERIN EX PADS: 1.0000 "application " | MEDICATED_PAD | CUTANEOUS | Status: DC | PRN

## 2019-04-15 MED ORDER — DIBUCAINE (PERIANAL) 1 % EX OINT: 1.0000 "application " | TOPICAL_OINTMENT | CUTANEOUS | Status: DC | PRN

## 2019-04-15 MED ORDER — NALOXONE HCL 4 MG/10ML IJ SOLN
1.0000 ug/kg/h | INTRAVENOUS | Status: DC | PRN
  Filled 2019-04-15: qty 5

## 2019-04-15 SURGICAL SUPPLY — 38 items
ADH SKN CLS APL DERMABOND .7 (GAUZE/BANDAGES/DRESSINGS)
APL SKNCLS STERI-STRIP NONHPOA (GAUZE/BANDAGES/DRESSINGS) ×1
BENZOIN TINCTURE PRP APPL 2/3 (GAUZE/BANDAGES/DRESSINGS) ×3 IMPLANT
CLAMP CORD UMBIL (MISCELLANEOUS) IMPLANT
CLOSURE STERI STRIP 1/2 X4 (GAUZE/BANDAGES/DRESSINGS) ×3 IMPLANT
CLOSURE WOUND 1/2 X4 (GAUZE/BANDAGES/DRESSINGS)
CLOTH BEACON ORANGE TIMEOUT ST (SAFETY) ×3 IMPLANT
DERMABOND ADVANCED (GAUZE/BANDAGES/DRESSINGS)
DERMABOND ADVANCED .7 DNX12 (GAUZE/BANDAGES/DRESSINGS) IMPLANT
DRSG OPSITE POSTOP 4X10 (GAUZE/BANDAGES/DRESSINGS) ×3 IMPLANT
ELECT REM PT RETURN 9FT ADLT (ELECTROSURGICAL) ×3
ELECTRODE REM PT RTRN 9FT ADLT (ELECTROSURGICAL) ×1 IMPLANT
EXTRACTOR VACUUM KIWI (MISCELLANEOUS) IMPLANT
GLOVE BIO SURGEON STRL SZ 6 (GLOVE) ×3 IMPLANT
GLOVE BIOGEL PI IND STRL 6 (GLOVE) ×2 IMPLANT
GLOVE BIOGEL PI IND STRL 7.0 (GLOVE) ×1 IMPLANT
GLOVE BIOGEL PI INDICATOR 6 (GLOVE) ×4
GLOVE BIOGEL PI INDICATOR 7.0 (GLOVE) ×2
GOWN STRL REUS W/TWL LRG LVL3 (GOWN DISPOSABLE) ×6 IMPLANT
KIT ABG SYR 3ML LUER SLIP (SYRINGE) ×3 IMPLANT
NEEDLE HYPO 25X5/8 SAFETYGLIDE (NEEDLE) ×3 IMPLANT
NS IRRIG 1000ML POUR BTL (IV SOLUTION) ×3 IMPLANT
PACK C SECTION WH (CUSTOM PROCEDURE TRAY) ×3 IMPLANT
PAD OB MATERNITY 4.3X12.25 (PERSONAL CARE ITEMS) ×3 IMPLANT
PENCIL SMOKE EVAC W/HOLSTER (ELECTROSURGICAL) ×3 IMPLANT
STRIP CLOSURE SKIN 1/2X4 (GAUZE/BANDAGES/DRESSINGS) IMPLANT
SUT CHROMIC 0 CTX 36 (SUTURE) ×9 IMPLANT
SUT MON AB 2-0 CT1 27 (SUTURE) ×3 IMPLANT
SUT PDS AB 0 CT1 27 (SUTURE) IMPLANT
SUT PLAIN 0 NONE (SUTURE) ×6 IMPLANT
SUT PLAIN 2 0 (SUTURE) ×3
SUT PLAIN 2 0 XLH (SUTURE) ×6 IMPLANT
SUT PLAIN ABS 2-0 CT1 27XMFL (SUTURE) ×1 IMPLANT
SUT VIC AB 0 CT1 36 (SUTURE) IMPLANT
SUT VIC AB 4-0 KS 27 (SUTURE) IMPLANT
TOWEL OR 17X24 6PK STRL BLUE (TOWEL DISPOSABLE) ×3 IMPLANT
TRAY FOLEY W/BAG SLVR 14FR LF (SET/KITS/TRAYS/PACK) IMPLANT
WATER STERILE IRR 1000ML POUR (IV SOLUTION) ×3 IMPLANT

## 2019-04-15 NOTE — Anesthesia Postprocedure Evaluation (Signed)
Anesthesia Post Note  Patient: [name]  Procedure(s) Performed: CESAREAN SECTION WITH BILATERAL TUBAL LIGATION (Bilateral )     Patient location during evaluation: PACU Anesthesia Type: Spinal Level of consciousness: oriented and awake and alert Pain management: pain level controlled Vital Signs Assessment: post-procedure vital signs reviewed and stable Respiratory status: spontaneous breathing, respiratory function stable and patient connected to nasal cannula oxygen Cardiovascular status: blood pressure returned to baseline and stable Postop Assessment: no headache, no backache and no apparent nausea or vomiting Anesthetic complications: no    Last Vitals:  Vitals:   04/15/19 1800 04/15/19 1805  BP:    Pulse:    Resp: 20   Temp: (!) 36.3 C   SpO2: 96% 99%    Last Pain:  Vitals:   04/15/19 1800  TempSrc: Oral  PainSc: 0-No pain   Pain Goal:                   Peja Allender L Kellina Dreese

## 2019-04-15 NOTE — Op Note (Signed)
[facility] PROCEDURE DATE: 04/15/2019  PREOPERATIVE DIAGNOSIS: Intrauterine pregnancy at  [redacted]w[redacted]d weeks gestation, Gestational hypertension, previous C/S x 2, desires sterility  POSTOPERATIVE DIAGNOSIS: The same  PROCEDURE:   Repeat Low Transverse Cesarean Section, bilateral salpingectomy  SURGEON:  Dr. Linda Hedges  ASSISTANT: Maida Sale  INDICATIONS: KORALYN PRESTAGE is a 33 y.o. G3P2002 at [redacted]w[redacted]d scheduled for cesarean section, bilateral salpingectomy secondary to [facility], previous C/S x 2, desires sterility.  The risks of cesarean section discussed with the patient included but were not limited to: bleeding which may require transfusion or reoperation; infection which may require antibiotics; injury to bowel, bladder, ureters or other surrounding organs; injury to the fetus; need for additional procedures including hysterectomy in the event of a life-threatening hemorrhage; placental abnormalities wth subsequent pregnancies, incisional problems, thromboembolic phenomenon and other postoperative/anesthesia complications. Patient understands risk of permanence and regret with BTL.  The patient concurred with the proposed plan, giving informed written consent for the procedure.    FINDINGS:  Viable female infant in cephalic presentation, APGARs 8,9: weight pending  Clear amniotic fluid.  Intact placenta, three vessel cord.  Grossly normal uterus, ovaries and fallopian tubes. .   ANESTHESIA:  Spinal ESTIMATED BLOOD LOSS: 600 ml SPECIMENS: Placenta sent to L&D, Bilateral fallopian tubes sent to pathology COMPLICATIONS: None immediate  PROCEDURE IN DETAIL:  The patient received intravenous antibiotics and had sequential compression devices applied to her lower extremities while in the preoperative area.  She was then taken to the operating room where spinal anesthesia was administered and was found to be adequate. She was then placed in a dorsal supine position with a leftward tilt, and prepped  and draped in a sterile manner.  A foley catheter was placed into her bladder and attached to constant gravity.  After an adequate timeout was performed, a Pfannenstiel skin incision was made with scalpel and carried through to the underlying layer of fascia. The fascia was incised in the midline and this incision was extended bilaterally using the Mayo scissors. Kocher clamps were applied to the superior aspect of the fascial incision and the underlying rectus muscles were dissected off bluntly. A similar process was carried out on the inferior aspect of the facial incision. The rectus muscles were separated in the midline bluntly and the peritoneum was entered bluntly. Bladder flap was created sharply and developed bluntly.  Bladder blade was placed.  A transverse hysterotomy was made with a scalpel and extended bilaterally bluntly. The bladder blade was then removed. The infant was successfully delivered, and cord was clamped and cut and infant was handed over to awaiting neonatology team. Uterine massage was then administered and the placenta delivered intact with three-vessel cord. The uterus was cleared of clot and debris.  The hysterotomy was closed with 0 chromic. Attention was then turned to the fallopian tube.  Right fallopian tube was elevated and using 0 chromic free ties and Bovie cautery, the fallopian tube was removed.  The left fallopian tube was removed in the same fashion.  Hemostasis was observed.  The uterus was returned to the pelvis.  The peritoneum and rectus muscles were noted to be hemostatic and were reapproximated using 2-0 monocryl in a running fashion.  The fascia was closed with 0-PDS in a running fashion with good restoration of anatomy.  The subcutaneus tissue was copiously irrigated.  The skin was closed with 4-0 vicryl in a subcuticular fashion.  Pt tolerated the procedure will.  All counts were correct x2.  Pt  went to the recovery room in stable condition.

## 2019-04-15 NOTE — Transfer of Care (Signed)
Immediate Anesthesia Transfer of Care Note  Patient: [name]  Procedure(s) Performed: CESAREAN SECTION WITH BILATERAL TUBAL LIGATION (Bilateral )  Patient Location: PACU  Anesthesia Type:Spinal  Level of Consciousness: awake and patient cooperative  Airway & Oxygen Therapy: Patient Spontanous Breathing  Post-op Assessment: Report given to RN and Post -op Vital signs reviewed and stable  Post vital signs: Reviewed and stable  Last Vitals:  Vitals Value Taken Time  BP 126/55 04/15/19 1027  Temp    Pulse 87 04/15/19 1029  Resp 21 04/15/19 1029  SpO2 99 % 04/15/19 1029  Vitals shown include unvalidated device data.  Last Pain:  Vitals:   04/15/19 0809  TempSrc: Oral  PainSc:          Complications: No apparent anesthesia complications

## 2019-04-15 NOTE — Progress Notes (Signed)
No change to H&P.  Abreanna Drawdy, DO 

## 2019-04-15 NOTE — Anesthesia Procedure Notes (Signed)
Spinal  Patient location during procedure: OR Start time: 04/15/2019 9:00 AM End time: 04/15/2019 9:10 AM Staffing Anesthesiologist: Freddrick March, MD Performed: anesthesiologist  Preanesthetic Checklist Completed: patient identified, surgical consent, pre-op evaluation, timeout performed, IV checked, risks and benefits discussed and monitors and equipment checked Spinal Block Patient position: sitting Prep: site prepped and draped and DuraPrep Patient monitoring: cardiac monitor, continuous pulse ox and blood pressure Approach: midline Location: L3-4 Injection technique: single-shot Needle Needle type: Pencan  Needle gauge: 24 G Needle length: 9 cm Assessment Sensory level: T6 Additional Notes Functioning IV was confirmed and monitors were applied. Sterile prep and drape, including hand hygiene and sterile gloves were used. The patient was positioned and the spine was prepped. The skin was anesthetized with lidocaine.  Free flow of clear CSF was obtained prior to injecting local anesthetic into the CSF.  The spinal needle aspirated freely following injection.  The needle was carefully withdrawn.  The patient tolerated the procedure well.

## 2019-04-15 NOTE — Anesthesia Preprocedure Evaluation (Addendum)
Anesthesia Evaluation  Patient identified by MRN, date of birth, ID band Patient awake    Reviewed: Allergy & Precautions, NPO status , Patient's Chart, lab work & pertinent test results  History of Anesthesia Complications (+) PONV and history of anesthetic complications  Airway Mallampati: II  TM Distance: >3 FB Neck ROM: Full    Dental no notable dental hx.    Pulmonary asthma ,    Pulmonary exam normal breath sounds clear to auscultation       Cardiovascular negative cardio ROS Normal cardiovascular exam Rhythm:Regular Rate:Normal     Neuro/Psych  Headaches, negative psych ROS   GI/Hepatic negative GI ROS, Neg liver ROS,   Endo/Other  negative endocrine ROS  Renal/GU negative Renal ROS  negative genitourinary   Musculoskeletal negative musculoskeletal ROS (+)   Abdominal   Peds  Hematology  (+) Blood dyscrasia (Hgb 10.4), anemia ,   Anesthesia Other Findings   Reproductive/Obstetrics (+) Pregnancy                            Anesthesia Physical Anesthesia Plan  ASA: II  Anesthesia Plan: Spinal   Post-op Pain Management:    Induction:   PONV Risk Score and Plan: Treatment may vary due to age or medical condition  Airway Management Planned: Natural Airway  Additional Equipment:   Intra-op Plan:   Post-operative Plan:   Informed Consent: I have reviewed the patients History and Physical, chart, labs and discussed the procedure including the risks, benefits and alternatives for the proposed anesthesia with the patient or authorized representative who has indicated his/her understanding and acceptance.     Dental advisory given  Plan Discussed with: CRNA  Anesthesia Plan Comments:         Anesthesia Quick Evaluation

## 2019-04-16 ENCOUNTER — Encounter (HOSPITAL_COMMUNITY): Payer: Self-pay | Admitting: Obstetrics & Gynecology

## 2019-04-16 LAB — CBC
HCT: 30.7 % — ABNORMAL LOW (ref 36.0–46.0)
Hemoglobin: 9.9 g/dL — ABNORMAL LOW (ref 12.0–15.0)
MCH: 26.4 pg (ref 26.0–34.0)
MCHC: 32.2 g/dL (ref 30.0–36.0)
MCV: 81.9 fL (ref 80.0–100.0)
Platelets: 263 10*3/uL (ref 150–400)
RBC: 3.75 MIL/uL — ABNORMAL LOW (ref 3.87–5.11)
RDW: 12.7 % (ref 11.5–15.5)
WBC: 14.3 10*3/uL — ABNORMAL HIGH (ref 4.0–10.5)
nRBC: 0 % (ref 0.0–0.2)

## 2019-04-16 LAB — BIRTH TISSUE RECOVERY COLLECTION (PLACENTA DONATION)

## 2019-04-16 MED ORDER — EPINEPHRINE TOPICAL FOR CIRCUMCISION 0.1 MG/ML: 1.0000 [drp] | TOPICAL | Status: DC | PRN

## 2019-04-16 MED ORDER — ACETAMINOPHEN FOR CIRCUMCISION 160 MG/5 ML: 40.0000 mg | Freq: Once | ORAL | Status: DC

## 2019-04-16 MED ORDER — WHITE PETROLATUM EX OINT: 1.0000 "application " | TOPICAL_OINTMENT | CUTANEOUS | Status: DC | PRN

## 2019-04-16 MED ORDER — SUCROSE 24% NICU/PEDS ORAL SOLUTION: 0.5000 mL | OROMUCOSAL | Status: DC | PRN

## 2019-04-16 MED ORDER — ACETAMINOPHEN FOR CIRCUMCISION 160 MG/5 ML: 40.0000 mg | ORAL | Status: DC | PRN

## 2019-04-16 MED ORDER — LIDOCAINE 1% INJECTION FOR CIRCUMCISION
0.8000 mL | INJECTION | Freq: Once | INTRAVENOUS | Status: DC
  Filled 2019-04-16: qty 1

## 2019-04-16 NOTE — Lactation Note (Signed)
This note was copied from a baby's chart. Lactation Consultation Note  Patient Name: [name] [id number] Date: 04/16/2019 Reason for consult: Initial assessment;Hyperbilirubinemia;Early term 37-38.6wks;Infant weight loss  Baby is 49 hours old  Awake and hungry, noted the baby to be jittery .  The Mother baby RN - Heide Guile and orientee were present and she mentioned the baby had a blood sugar check and it was WNL.  LC checked the results - 5 .  Mom latched the baby in cross cradle with depth and  LC checked the latched and eased down the chin.  Swallows noted and LC reminded mom to do intermittent breast compressions . Increased swallows noted. Baby fed the 1st breast 12 mins and released . Mom latched the baby on the other breast and he fed 5 mins with swallows and released, nipple well rounded.  Dad swaddled the baby with the bili lights.  The [facility] plans to set up a DEBP for post pumping  After feedings.  Mom aware of the [facility] resources after D/C at [facility].  Per mom has 2 DEBP 's at home [facility] and a Sports administrator) .     Maternal Data Has patient been taught Hand Expression?: Yes(per mom comfortable) Does the patient have breastfeeding experience prior to this delivery?: Yes  Feeding Feeding Type: Breast Fed  LATCH Score Latch: Grasps breast easily, tongue down, lips flanged, rhythmical sucking.  Audible Swallowing: Spontaneous and intermittent  Type of Nipple: Everted at rest and after stimulation  Comfort (Breast/Nipple): Soft / non-tender  Hold (Positioning): Assistance needed to correctly position infant at breast and maintain latch.  LATCH Score: 9  Interventions Interventions: Breast feeding basics reviewed;Assisted with latch;Skin to skin;Breast massage;Hand express;Breast compression;Adjust position;Support pillows;Position options(Simultaneous filing. User may not have seen previous data.)  Lactation Tools Discussed/Used WIC Program: No Pump Review: Milk  Storage   Consult Status      Jerlyn Ly Helayne Metsker 04/16/2019, 3:17 PM

## 2019-04-16 NOTE — Progress Notes (Signed)
Subjective: Postpartum Day 1: Cesarean Delivery -gHTN - R x2 with b/l salpingectomy Patient reports tolerating PO and no problems voiding.    Objective: Vital signs in last 24 hours: Temp:  [97.4 F (36.3 C)-99.1 F (37.3 C)] 97.6 F (36.4 C) (08/19 0500) Pulse Rate:  [66-88] 74 (08/19 0500) Resp:  [16-29] 17 (08/19 0500) BP: (114-132)/(55-79) 126/74 (08/19 0500) SpO2:  [96 %-100 %] 98 % (08/19 0500)  Physical Exam:  General: alert, cooperative and appears stated age 33: appropriate Uterine Fundus: firm Incision: healing well, no significant drainage, no dehiscence, no significant erythema DVT Evaluation: No evidence of DVT seen on physical exam. Negative Homan's sign. No cords or calf tenderness. No significant calf/ankle edema.  Recent Labs    04/16/19 0638  HGB 9.9*  HCT 30.7*    Assessment/Plan: Status post Cesarean section. Doing well postoperatively.  Continue current care.Bp stable. D/W patient female infant circumcision, risks/benefits reviewed. All questions answered.   [name] 04/16/2019, 9:54 AM

## 2019-04-17 MED ORDER — ACETAMINOPHEN 325 MG PO TABS: 650.0000 mg | ORAL_TABLET | Freq: Four times a day (QID) | ORAL | 0 refills | Status: DC | PRN

## 2019-04-17 MED ORDER — IBUPROFEN 800 MG PO TABS: 800.0000 mg | ORAL_TABLET | Freq: Three times a day (TID) | ORAL | 0 refills | Status: DC | PRN

## 2019-04-17 MED ORDER — OXYCODONE HCL 5 MG PO TABS: 5.0000 mg | ORAL_TABLET | Freq: Four times a day (QID) | ORAL | 0 refills | Status: DC | PRN

## 2019-04-17 NOTE — Discharge Summary (Signed)
Obstetric Discharge Summary Reason for Admission: cesarean section Prenatal Procedures: none Intrapartum Procedures: cesarean: low cervical, transverse and bilateral salpingectomy Postpartum Procedures: none Complications-Operative and Postpartum: none Hemoglobin  Date Value Ref Range Status  04/16/2019 9.9 (L) 12.0 - 15.0 g/dL Final   HCT  Date Value Ref Range Status  04/16/2019 30.7 (L) 36.0 - 46.0 % Final    Physical Exam:  General: alert, cooperative and no distress Lochia: appropriate Uterine Fundus: firm Incision: healing well DVT Evaluation: No evidence of DVT seen on physical exam.  Discharge Diagnoses: Term Pregnancy-delivered  Discharge Information: Date: 04/17/2019 Activity: pelvic rest Diet: routine Medications: PNV, Ibuprofen and oxycodone Condition: stable Instructions: refer to practice specific booklet Discharge to: home   Newborn Data: Live born female  Birth Weight: 10 lb 3.5 oz (4635 g) APGAR: 8, 9  Newborn Delivery   Birth date/time: 04/15/2019 09:32:00 Delivery type: C-Section, Low Transverse Trial of labor: No C-section categorization: Repeat      Home with mother.  Shon Millet II 04/17/2019, 11:33 AM

## 2019-04-17 NOTE — Progress Notes (Signed)
Subjective: Postpartum Day 2: Cesarean Delivery Patient reports incisional pain, tolerating PO, + flatus and no problems voiding.   No HA, no vision change Objective: Vital signs in last 24 hours: Pulse Rate:  [70-74] 70 (08/20 0626) Resp:  [16-18] 18 (08/20 0626) BP: (142-143)/(71-75) 142/75 (08/20 0626) SpO2:  [99 %-100 %] 100 % (08/20 0626)  Physical Exam:  General: alert, cooperative and no distress Lochia: appropriate Uterine Fundus: firm Incision: healing well DVT Evaluation: No evidence of DVT seen on physical exam.  Recent Labs    04/16/19 0638  HGB 9.9*  HCT 30.7*    Assessment/Plan: Status post Cesarean section. Doing well postoperatively.  Continue current care. BP up some this am>will continue to monitor  Shon Millet II 04/17/2019, 7:25 AM

## 2019-04-17 NOTE — Discharge Instructions (Signed)
BP check in office 1 week

## 2019-08-14 ENCOUNTER — Ambulatory Visit: Payer: BC Managed Care – PPO | Attending: Internal Medicine

## 2019-08-14 ENCOUNTER — Other Ambulatory Visit: Payer: Self-pay

## 2019-08-14 DIAGNOSIS — Z20822 Contact with and (suspected) exposure to covid-19: Secondary | ICD-10-CM

## 2019-08-15 LAB — NOVEL CORONAVIRUS, NAA: SARS-CoV-2, NAA: NOT DETECTED

## 2019-08-18 ENCOUNTER — Other Ambulatory Visit: Payer: BC Managed Care – PPO

## 2020-04-09 ENCOUNTER — Encounter: Payer: Self-pay | Admitting: Emergency Medicine

## 2020-04-09 ENCOUNTER — Ambulatory Visit
Admission: EM | Admit: 2020-04-09 | Discharge: 2020-04-09 | Disposition: A | Discharge status: Home / Self Care | Payer: BC Managed Care – PPO | Attending: Emergency Medicine | Admitting: Emergency Medicine

## 2020-04-09 DIAGNOSIS — Z1152 Encounter for screening for COVID-19: Secondary | ICD-10-CM

## 2020-04-09 DIAGNOSIS — J069 Acute upper respiratory infection, unspecified: Secondary | ICD-10-CM

## 2020-04-09 MED ORDER — BENZONATATE 100 MG PO CAPS: 100.0000 mg | ORAL_CAPSULE | Freq: Three times a day (TID) | ORAL | 0 refills | Status: DC

## 2020-04-09 MED ORDER — CETIRIZINE HCL 10 MG PO TABS: 10.0000 mg | ORAL_TABLET | Freq: Every day | ORAL | 0 refills | Status: DC

## 2020-04-09 MED ORDER — FLUTICASONE PROPIONATE 50 MCG/ACT NA SUSP: 1.0000 | Freq: Every day | NASAL | 0 refills | Status: DC

## 2020-04-09 MED ORDER — PREDNISONE 10 MG PO TABS: 20.0000 mg | ORAL_TABLET | Freq: Every day | ORAL | 0 refills | Status: DC

## 2020-04-09 NOTE — ED Provider Notes (Signed)
[facility]   [id number] 04/09/20 Arrival Time: 1605   CC: COVID symptoms  SUBJECTIVE: History from: patient.  [name] is a 34 y.o. female who presented to the urgent care with a complaint of sneezing, congestion, headache, eye pain and body aches that started today.  Reports sick exposure to COVID.  Denies recent travel.  Has tried OTC medication without relief.  Denies aggravating factors.  Denies previous symptoms in the past.   Denies fever, chills, fatigue, sinus pain, rhinorrhea, sore throat, SOB, wheezing, chest pain, nausea, changes in bowel or bladder habits.    ROS: As per HPI.  All other pertinent ROS negative.     Past Medical History:  Diagnosis Date   Asthma    as a child; no longer needs an inhaler   Headache 04/04/2019   migraines once a month with this pregnancy only   PONV (postoperative nausea and vomiting)    vomited with last c section   Past Surgical History:  Procedure Laterality Date   CESAREAN SECTION N/A 06/20/2014   Procedure: CESAREAN SECTION;  Surgeon: Mitchel Honour, DO;  Location: [facility];  Service: Obstetrics;  Laterality: N/A;   CESAREAN SECTION N/A 07/26/2016   Procedure: CESAREAN SECTION;  Surgeon: Mitchel Honour, DO;  Location: [facility];  Service: Obstetrics;  Laterality: N/A;  Repeat edc 08/07/16 nkda request RNFA   CESAREAN SECTION WITH BILATERAL TUBAL LIGATION Bilateral 04/15/2019   Procedure: CESAREAN SECTION WITH BILATERAL TUBAL LIGATION;  Surgeon: Mitchel Honour, DO;  Location: [facility];  Service: Obstetrics;  Laterality: Bilateral;  Repeat edc 05/04/19 NKDA Tracey RNFA   TONSILLECTOMY AND ADENOIDECTOMY     WRIST GANGLION EXCISION     left wrist   No Known Allergies No current facility-administered medications on file prior to encounter.   Current Outpatient Medications on File Prior to Encounter  Medication Sig Dispense Refill   acetaminophen (TYLENOL) 325 MG tablet Take 2 tablets (650 mg total)  by mouth every 6 (six) hours as needed for mild pain (temperature > 101.5.). 60 tablet 0   ibuprofen (ADVIL) 800 MG tablet Take 1 tablet (800 mg total) by mouth every 8 (eight) hours as needed for moderate pain. 30 tablet 0   oxyCODONE (ROXICODONE) 5 MG immediate release tablet Take 1 tablet (5 mg total) by mouth every 6 (six) hours as needed for severe pain. 20 tablet 0   Prenatal Vit-Fe Fumarate-FA (PRENATAL MULTIVITAMIN) TABS tablet Take 1 tablet by mouth daily at 12 noon.     Social History   Socioeconomic History   Marital status: Married    Spouse name: Not on file   Number of children: Not on file   Years of education: Not on file   Highest education level: Not on file  Occupational History   Not on file  Tobacco Use   Smoking status: Never Smoker   Smokeless tobacco: Never Used  Vaping Use   Vaping Use: Never used  Substance and Sexual Activity   Alcohol use: No   Drug use: No   Sexual activity: Yes    Birth control/protection: None  Other Topics Concern   Not on file  Social History Narrative   Not on file   Social Determinants of Health   Financial Resource Strain:    Difficulty of Paying Living Expenses:   Food Insecurity:    Worried About Radiation protection practitioner of Food in the Last Year:    Ran Out of Food in the Last Year:  Transportation Needs:    Freight forwarder (Medical):    Lack of Transportation (Non-Medical):   Physical Activity:    Days of Exercise per Week:    Minutes of Exercise per Session:   Stress:    Feeling of Stress :   Social Connections:    Frequency of Communication with Friends and Family:    Frequency of Social Gatherings with Friends and Family:    Attends Religious Services:    Active Member of Clubs or Organizations:    Attends Engineer, structural:    Marital Status:   Intimate Partner Violence:    Fear of Current or Ex-Partner:    Emotionally Abused:    Physically Abused:    Sexually  Abused:    Family History  Problem Relation Age of Onset   Cancer Mother        gallbladder   Cancer Maternal Aunt        breast   Thyroid disease Maternal Aunt    Thyroid disease Paternal Aunt     OBJECTIVE:  Vitals:   04/09/20 1613 04/09/20 1616  BP:  120/73  Pulse:  71  Resp:  18  Temp:  (!) 97.5 F (36.4 C)  TempSrc:  Oral  SpO2:  96%  Weight: 268 lb (121.6 kg)   Height: 6' (1.829 m)      General appearance: alert; appears fatigued, but nontoxic; speaking in full sentences and tolerating own secretions HEENT: NCAT; Ears: EACs clear, TMs pearly gray; Eyes: PERRL.  EOM grossly intact. Sinuses: nontender; Nose: nares patent without rhinorrhea, Throat: oropharynx clear, tonsils non erythematous or enlarged, uvula midline  Neck: supple without LAD Lungs: unlabored respirations, symmetrical air entry; cough: mild; no respiratory distress; CTAB Heart: regular rate and rhythm.  Radial pulses 2+ symmetrical bilaterally Skin: warm and dry Psychological: alert and cooperative; normal mood and affect  LABS:  No results found for this or any previous visit (from the past 24 hour(s)).   ASSESSMENT & PLAN:  1. URI with cough and congestion   2. Encounter for screening for COVID-19     Meds ordered this encounter  Medications   fluticasone (FLONASE) 50 MCG/ACT nasal spray    Sig: Place 1 spray into both nostrils daily for 14 days.    Dispense:  16 g    Refill:  0   cetirizine (ZYRTEC ALLERGY) 10 MG tablet    Sig: Take 1 tablet (10 mg total) by mouth daily.    Dispense:  30 tablet    Refill:  0   benzonatate (TESSALON) 100 MG capsule    Sig: Take 1 capsule (100 mg total) by mouth every 8 (eight) hours.    Dispense:  30 capsule    Refill:  0   predniSONE (DELTASONE) 10 MG tablet    Sig: Take 2 tablets (20 mg total) by mouth daily.    Dispense:  15 tablet    Refill:  0    Discharge Instructions.    COVID testing ordered.  It will take between 2-7 days for  test results.  Someone will contact you regarding abnormal results.    In the meantime: You should remain isolated in your home for 10 days from symptom onset AND greater than 24 hours after symptoms resolution (absence of fever without the use of fever-reducing medication and improvement in respiratory symptoms), whichever is longer Get plenty of rest and push fluids Tessalon Perles prescribed for cough Prednisone was prescribed Zyrtec for nasal congestion, runny  nose, and/or sore throat Flonase for nasal congestion and runny nose Use medications daily for symptom relief Use OTC medications like ibuprofen or tylenol as needed fever or pain Call or go to the ED if you have any new or worsening symptoms such as fever, worsening cough, shortness of breath, chest tightness, chest pain, turning blue, changes in mental status, etc...   Reviewed expectations re: course of current medical issues. Questions answered. Outlined signs and symptoms indicating need for more acute intervention. Patient verbalized understanding. After Visit Summary given.      Note: This document was prepared using Dragon voice recognition software and may include unintentional dictation errors.    Durward Parcel, FNP 04/09/20 1646

## 2020-04-09 NOTE — Discharge Instructions (Signed)
COVID testing ordered.  It will take between 2-7 days for test results.  Someone will contact you regarding abnormal results.    In the meantime: You should remain isolated in your home for 10 days from symptom onset AND greater than 24 hours after symptoms resolution (absence of fever without the use of fever-reducing medication and improvement in respiratory symptoms), whichever is longer Get plenty of rest and push fluids Tessalon Perles prescribed for cough Prednisone was prescribed Zyrtec for nasal congestion, runny nose, and/or sore throat Flonase for nasal congestion and runny nose Use medications daily for symptom relief Use OTC medications like ibuprofen or tylenol as needed fever or pain Call or go to the ED if you have any new or worsening symptoms such as fever, worsening cough, shortness of breath, chest tightness, chest pain, turning blue, changes in mental status, etc..Marland Kitchen

## 2020-04-09 NOTE — ED Triage Notes (Signed)
Since 1030 today sneezing, congestion, headache, eye pain. Would like covid test.

## 2020-04-10 LAB — SARS-COV-2, NAA 2 DAY TAT

## 2020-04-10 LAB — NOVEL CORONAVIRUS, NAA: SARS-CoV-2, NAA: NOT DETECTED

## 2021-11-23 ENCOUNTER — Emergency Department (HOSPITAL_COMMUNITY): Payer: BC Managed Care – PPO

## 2021-11-23 ENCOUNTER — Other Ambulatory Visit: Payer: Self-pay

## 2021-11-23 ENCOUNTER — Encounter (HOSPITAL_COMMUNITY): Payer: Self-pay

## 2021-11-23 ENCOUNTER — Emergency Department (HOSPITAL_COMMUNITY)
Admission: EM | Admit: 2021-11-23 | Discharge: 2021-11-23 | Disposition: A | Discharge status: Home / Self Care | Payer: BC Managed Care – PPO | Attending: Emergency Medicine | Admitting: Emergency Medicine

## 2021-11-23 DIAGNOSIS — J45909 Unspecified asthma, uncomplicated: Secondary | ICD-10-CM | POA: Diagnosis not present

## 2021-11-23 DIAGNOSIS — W2107XA Struck by softball, initial encounter: Secondary | ICD-10-CM | POA: Diagnosis not present

## 2021-11-23 DIAGNOSIS — Y9369 Activity, other involving other sports and athletics played as a team or group: Secondary | ICD-10-CM | POA: Insufficient documentation

## 2021-11-23 DIAGNOSIS — S52501A Unspecified fracture of the lower end of right radius, initial encounter for closed fracture: Secondary | ICD-10-CM | POA: Diagnosis not present

## 2021-11-23 DIAGNOSIS — S6991XA Unspecified injury of right wrist, hand and finger(s), initial encounter: Secondary | ICD-10-CM | POA: Diagnosis present

## 2021-11-23 MED ORDER — MORPHINE SULFATE (PF) 4 MG/ML IV SOLN
4.0000 mg | Freq: Once | INTRAVENOUS | Status: AC
  Administered 2021-11-23: 4 mg via INTRAVENOUS
  Filled 2021-11-23: qty 1

## 2021-11-23 MED ORDER — SENNOSIDES-DOCUSATE SODIUM 8.6-50 MG PO TABS: 1.0000 | ORAL_TABLET | Freq: Every evening | ORAL | 0 refills | Status: DC | PRN

## 2021-11-23 MED ORDER — IBUPROFEN 800 MG PO TABS: 800.0000 mg | ORAL_TABLET | Freq: Three times a day (TID) | ORAL | 0 refills | Status: DC | PRN

## 2021-11-23 MED ORDER — ONDANSETRON HCL 4 MG/2ML IJ SOLN
4.0000 mg | Freq: Once | INTRAMUSCULAR | Status: AC
  Administered 2021-11-23: 4 mg via INTRAVENOUS
  Filled 2021-11-23: qty 2

## 2021-11-23 MED ORDER — MORPHINE SULFATE 10 MG/ML IJ SOLN
INTRAMUSCULAR | Status: AC | PRN
  Administered 2021-11-23: 4 mg via INTRAVENOUS

## 2021-11-23 MED ORDER — ETOMIDATE 2 MG/ML IV SOLN
10.0000 mg | Freq: Once | INTRAVENOUS | Status: AC
  Administered 2021-11-23: 8 mg via INTRAVENOUS
  Filled 2021-11-23: qty 10

## 2021-11-23 MED ORDER — OXYCODONE-ACETAMINOPHEN 5-325 MG PO TABS: 1.0000 | ORAL_TABLET | Freq: Four times a day (QID) | ORAL | 0 refills | Status: DC | PRN

## 2021-11-23 NOTE — Discharge Instructions (Signed)
You were seen in the emergency room today after breaking her wrist.  You will need follow-up with her hand specialist.  I listed the contact information for Dr. Bari Edward office.  Please call tomorrow.  Keep the wrist elevated above the level of your heart and you can apply a cool compress over the next 12 hours to help reduce swelling. ? ?Percocet can cause drowsiness and impair your ability to drive a car.  Do not take this with other pain medicines or alcohol.  It can also cause constipation and I have called in some constipation medicine for you as well. ?

## 2021-11-23 NOTE — ED Notes (Signed)
DC instructions reviewed with pt. PT verbalized understanding. PT DC °

## 2021-11-23 NOTE — ED Triage Notes (Signed)
Pt was at softball game and fell onto right wrist. There is an obvious deformity to wrist. Pulses intact. ?

## 2021-11-23 NOTE — Progress Notes (Signed)
Orthopedic Tech Progress Note ?Patient Details:  ?[name] ?Sep 19, 1985 ?[id number] ? ?Ortho Devices ?Type of Ortho Device: Sugartong splint, Sling immobilizer ?Ortho Device/Splint Location: RUE ?Ortho Device/Splint Interventions: Ordered, Application, Adjustment ?  ?Post Interventions ?Patient Tolerated: Well ?Instructions Provided: Care of device ?Assisted MD with splint application by wrapping splint as he held it in place. ? ?[place] ?11/23/2021, 9:51 PM ? ?

## 2021-11-23 NOTE — ED Provider Notes (Signed)
? ?Emergency Department Provider Note ? ? ?I have reviewed the triage vital signs and the nursing notes. ? ? ?HISTORY ? ?Chief Complaint ?Arm Injury (Pt fell on right wrist and there is obvious deformity) ? ? ?HPI ?Doris FragminJessica L Long is a 36 y.o. female with past history reviewed below presents emergency department for evaluation after mechanical fall with right wrist pain and deformity.  Patient was playing softball and states the ball was hit in her direction.  She attempted to jump back and out of the way but lost her balance falling backwards onto her outstretched right hand.  She felt immediate pain and noted a deformity.  No laceration or bleeding.  No numbness.  EMS arrived and applied a splint and she arrives for evaluation. ? ? ?Past Medical History:  ?Diagnosis Date  ? Asthma   ? as a child; no longer needs an inhaler  ? Headache 04/04/2019  ? migraines once a month with this pregnancy only  ? PONV (postoperative nausea and vomiting)   ? vomited with last c section  ? ? ?Review of Systems ? ?Constitutional: No fever/chills ?Genitourinary: Negative for dysuria. ?Musculoskeletal: Positive right wrist pain.  ?Skin: Negative for rash. ?Neurological: Negative for focal weakness or numbness. ? ? ?____________________________________________ ? ? ?PHYSICAL EXAM: ? ?VITAL SIGNS: ?ED Triage Vitals  ?Enc Vitals Group  ?   BP 11/23/21 1929 127/79  ?   Pulse Rate 11/23/21 1929 (!) 104  ?   Resp 11/23/21 1929 16  ?   Temp 11/23/21 1929 (!) 97.4 ?F (36.3 ?C)  ?   Temp Source 11/23/21 1929 Oral  ?   SpO2 11/23/21 1920 99 %  ? ? ?Constitutional: Alert and oriented. Well appearing and in no acute distress. ?Eyes: Conjunctivae are normal.  ?Head: Atraumatic. ?Nose: No congestion/rhinnorhea. ?Mouth/Throat: Mucous membranes are moist.  ?Neck: No stridor.  No cervical spine tenderness to palpation. ?Cardiovascular: Good peripheral circulation.  ?Respiratory: Normal respiratory effort. ?Gastrointestinal: No distention.   ?Musculoskeletal: Swelling with deformity of the right wrist.  No elbow tenderness.  No apparent finger injury.  ?Neurologic:  Normal speech and language. Normal sensation in the right hand.  ?Skin:  Skin is warm, dry and intact. No rash noted. ? ?____________________________________________ ? ?RADIOLOGY ? ?DG Wrist 2 Views Right ? ?Result Date: 11/23/2021 ?CLINICAL DATA:  post reduction. Pt was at softball game and fell onto right wrist. There is an obvious deformity to wrist. Pulses intact." Reason for exam: post reduction. EXAM: RIGHT WRIST - 2 VIEW COMPARISON:  X-ray right wrist 11/23/2021 8:27 p.m. FINDINGS: Interval placement of a cast with slightly improved but persistent dorsal angulation and dorsal lateral displacement of a markedly comminuted, likely intra-articular, distal radial fracture. Slight improved anatomical alignment of a displaced ulnar styloid fracture. No dislocation. There is no evidence of arthropathy or other focal bone abnormality. Subcutaneus soft tissue edema. IMPRESSION: 1. Slightly improved but persistent dorsal angulation and dorsal lateral displacement of a markedly comminuted, likely intra-articular, distal radial fracture. 2. Slight improved anatomical alignment of a displaced ulnar styloid fracture. Electronically Signed   By: Tish FredericksonMorgane  Naveau M.D.   On: 11/23/2021 22:19  ? ?DG Wrist Complete Right ? ?Result Date: 11/23/2021 ?CLINICAL DATA:  Right wrist fracture. EXAM: RIGHT WRIST - COMPLETE 3+ VIEW COMPARISON:  None. FINDINGS: There is a comminuted fracture of the distal radial metaphysis with dorsal angulation. An ulnar styloid fracture is also noted. Soft tissue swelling is present about the wrist. IMPRESSION: 1. Comminuted fracture of the distal  radius with dorsal angulation. 2. Ulnar styloid fracture. Electronically Signed   By: Thornell Sartorius M.D.   On: 11/23/2021 20:47   ? ?____________________________________________ ? ? ?PROCEDURES ? ?Procedure(s) performed:  ? ?Reduction of  fracture ? ?Date/Time: 11/23/2021 10:44 PM ?Performed by: Maia Plan, MD ?Authorized by: Maia Plan, MD  ?Consent: Written consent obtained. ?Risks and benefits: risks, benefits and alternatives were discussed ?Consent given by: patient ?Required items: required blood products, implants, devices, and special equipment available ?Patient identity confirmed: verbally with patient and arm band ?Time out: Immediately prior to procedure a "time out" was called to verify the correct patient, procedure, equipment, support staff and site/side marked as required. ?Local anesthesia used: no ? ?Anesthesia: ?Local anesthesia used: no ? ?Sedation: ?Patient sedated: yes ?Sedation type: (deep) ?Sedatives: etomidate ?Analgesia: morphine ?Vitals: Vital signs were monitored during sedation. ? ?Patient tolerance: patient tolerated the procedure well with no immediate complications ?Comments: After adequate sedation was achieved, the patient's right wrist was pulled to length and straightened.  I held it in this position while the orthopedic tech splinted the extremity and placed her in a sling.  The splint was molded around the wrist.  Reviewed postreduction x-rays. ? ? ?Marland KitchenSedation ? ?Date/Time: 11/23/2021 10:45 PM ?Performed by: Maia Plan, MD ?Authorized by: Maia Plan, MD  ? ?Consent:  ?  Consent obtained:  Written ?  Consent given by:  Patient ?  Risks discussed:  Allergic reaction, dysrhythmia, inadequate sedation, nausea, vomiting, respiratory compromise necessitating ventilatory assistance and intubation, prolonged sedation necessitating reversal and prolonged hypoxia resulting in organ damage ?Universal protocol:  ?  Immediately prior to procedure, a time out was called: yes   ?  Patient identity confirmed:  Verbally with patient and arm band ?Indications:  ?  Procedure performed:  Fracture reduction ?  Procedure necessitating sedation performed by:  Physician performing sedation ?Pre-sedation assessment:  ?  Time  since last food or drink:  6 hours ?  ASA classification: class 1 - normal, healthy patient   ?  Mallampati score:  I - soft palate, uvula, fauces, pillars visible ?  Pre-sedation assessments completed and reviewed: airway patency, cardiovascular function, hydration status, mental status, nausea/vomiting, pain level, respiratory function and temperature   ?Immediate pre-procedure details:  ?  Reassessment: Patient reassessed immediately prior to procedure   ?  Reviewed: vital signs, relevant labs/tests and NPO status   ?  Verified: bag valve mask available, emergency equipment available, intubation equipment available, IV patency confirmed, oxygen available and suction available   ?Procedure details (see MAR for exact dosages):  ?  Preoxygenation:  Nasal cannula ?  Sedation:  Etomidate ?  Intended level of sedation: deep ?  Analgesia:  Morphine ?  Intra-procedure monitoring:  Blood pressure monitoring, cardiac monitor, continuous pulse oximetry, continuous capnometry, frequent LOC assessments and frequent vital sign checks ?  Intra-procedure events: none   ?  Total Provider sedation time (minutes):  15 ?Post-procedure details:  ?  Attendance: Constant attendance by certified staff until patient recovered   ?  Recovery: Patient returned to pre-procedure baseline   ?  Post-sedation assessments completed and reviewed: airway patency, cardiovascular function, hydration status, mental status, nausea/vomiting, pain level, respiratory function and temperature   ?  Patient is stable for discharge or admission: no   ?  Procedure completion:  Tolerated well, no immediate complications ? ? ?____________________________________________ ? ? ?INITIAL IMPRESSION / ASSESSMENT AND PLAN / ED COURSE ? ?Pertinent labs & imaging results that  were available during my care of the patient were reviewed by me and considered in my medical decision making (see chart for details). ?  ?This patient is Presenting for Evaluation of fall, which  does require a range of treatment options, and is a complaint that involves a high risk of morbidity and mortality. ? ?The Differential Diagnoses include wrist fracture, dislocation, sprain, compartment syndrom

## 2022-06-23 ENCOUNTER — Ambulatory Visit: Payer: Self-pay | Admitting: Orthopedic Surgery

## 2022-06-23 DIAGNOSIS — M5126 Other intervertebral disc displacement, lumbar region: Secondary | ICD-10-CM

## 2022-06-23 NOTE — H&P (Signed)
[name] is an 36 y.o. female.   Chief Complaint: back and left leg pain, numbness, weakness HPI: Location: left Quality: improving Severity: mild; pain level 1/10 Duration: 2 months; >1 month since last flare up Context: lifting Alleviating Factors: stretching; PT/OT Associated Symptoms: numbness; tingling Previous Surgery: none Prior Imaging: x ray; MRI Previous Injections: none Previous PT: helped a little; dry needling has helped the burning in her left calf Notes: Pt was referred by Dr. Gioffre. Reports a fall off of a horse when she was younger, has still been riding horses through the years. She has had left lower extremity sciatic pain on and off for the last 5 years, had difficult pregnancies with large babies, 10 pounds, seem to have pain during and after. Her children are now 3, 5 and 8 years old. Reports chronic tightness in the left leg. Earlier this year she had a bad flareup of sciatic pain and went to PT for that, took about 7 months to fully resolve. She did not have numbness or weakness at that point only pain in the left leg. She is about 1 month out from onset of this most recent severe flareup with associated numbness and weakness. For about a month prior to that she did have some extra tightness but the day of onset she walked into her office and was carrying her wallet and cup and started having severe pain down the left leg. She initially did have some back pain with this as well but that has since improved. Pain radiates into the calf and lateral foot in the S1 distribution and she does report associated burning, numbness and tingling as well as weakness. The burning is some better following PT where she has been doing dry needling and a lot of stretching. The night of onset she reports she could not feel her foot. She did initially have an IM injection of Toradol and steroid at her PCP and did a round of prednisone. 2 weeks ago she had a massage and after that both  legs felt numb but otherwise she has had no right lower extremity symptoms. She is more sore after more activity. 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 She does typically stay very active with [employer] and other workouts however with this severe flareup she has cut back on all of that activity.  Past Medical History:  Diagnosis Date   Asthma    as a child; no longer needs an inhaler   Headache 04/04/2019   migraines once a month with this pregnancy only   PONV (postoperative nausea and vomiting)    vomited with last c section    Past Surgical History:  Procedure Laterality Date   CESAREAN SECTION N/A 06/20/2014   Procedure: CESAREAN SECTION;  Surgeon: Megan Morris, DO;  Location: [facility];  Service: Obstetrics;  Laterality: N/A;   CESAREAN SECTION N/A 07/26/2016   Procedure: CESAREAN SECTION;  Surgeon: Megan Morris, DO;  Location: [facility];  Service: Obstetrics;  Laterality: N/A;  Repeat edc 08/07/16 nkda request RNFA   CESAREAN SECTION WITH BILATERAL TUBAL LIGATION Bilateral 04/15/2019   Procedure: CESAREAN SECTION WITH BILATERAL TUBAL LIGATION;  Surgeon: Morris, Megan, DO;  Location: [facility];  Service: Obstetrics;  Laterality: Bilateral;  Repeat edc 05/04/19 NKDA Tracey RNFA   TONSILLECTOMY AND ADENOIDECTOMY     WRIST GANGLION EXCISION     left wrist    Family History  Problem Relation   Age of Onset   Cancer Mother        gallbladder   Cancer Maternal Aunt        breast   Thyroid disease Maternal Aunt    Thyroid disease Paternal Aunt    Social History:  reports that she has never smoked. She has never used smokeless tobacco. She reports that she does not drink alcohol and does not use drugs.  Allergies: No Known Allergies  Current medications: mupirocin 2 % topical ointment predniSONE 10 mg tablet  Review of Systems  Constitutional: Negative.   HENT: Negative.    Eyes: Negative.   Respiratory:  Negative.    Cardiovascular: Negative.   Gastrointestinal: Negative.   Endocrine: Negative.   Genitourinary: Negative.   Musculoskeletal:  Positive for back pain and gait problem.  Allergic/Immunologic: Negative.   Neurological:  Positive for weakness and numbness.    unknown if currently breastfeeding. Physical Exam Constitutional:      Appearance: Normal appearance.  HENT:     Head: Normocephalic and atraumatic.     Right Ear: External ear normal.     Left Ear: External ear normal.     Nose: Nose normal.     Mouth/Throat:     Pharynx: Oropharynx is clear.  Eyes:     Conjunctiva/sclera: Conjunctivae normal.  Cardiovascular:     Rate and Rhythm: Normal rate and regular rhythm.     Pulses: Normal pulses.     Heart sounds: Normal heart sounds.  Pulmonary:     Effort: Pulmonary effort is normal.     Breath sounds: Normal breath sounds.  Abdominal:     General: Bowel sounds are normal.  Musculoskeletal:     Cervical back: Normal range of motion.     Comments: Patient is awake, alert, and oriented 3. Well-nourished and well-developed. Pleasant. Uncomfortable, seated but leaning to the right. No assistive devices.  On examination of the lumbar spine, nontender to palpation through the spinous processes and paraspinous musculature. Nontender bilateral buttocks and over the greater trochanter of the hips. Decreased flexion and extension lumbar spine. Positive seated straight leg raise on the left reproducing back, buttock and leg pain, negative on the right. Trace weakness with dorsiflexion left foot, no other weakness noted. No groin pain with rotation of the hips bilaterally. Decreased Achilles reflex on the left. No clonus present, negative Babinski. Decreased sensation lateral aspect of the left foot S1 distribution. No calf pain or sign of DVT.   Neurological:     Mental Status: She is alert.    2 view lumbar x-rays reviewed flexion and extension ordered, obtained, reviewed  today and prior lumbar x-rays reviewed. Disc degeneration L4-5 and L5-S1. No listhesis noted.  MRI images and report reviewed today by Dr. Beane. There is a large central and left-sided subarticular disc extrusion L5-S1 with caudal migration causing severe left subarticular zone and lateral recess narrowing prominently impinging and displacing the descending S1 roots on the left side. There is also moderate displacement of the descending left S2 roots. Disc desiccation L4-5 and L5-S1.  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 Discussed the importance of activity modifications, core strengthening and core motion to prevent exacerbations. ADL sheet reviewed and given. We discussed options. Given that   she has failed steroid injected and orally as well as anti-inflammatories and still has ongoing neurologic deficits including numbness and weakness with a large disc herniation would recommend proceeding with lumbar decompression L5-S1 to the left. We discussed the surgery itself as well as risks, complications and alternatives including but not limited to DVT, PE, failure of procedure, need for secondary procedure, CSF leak, anesthesia risk and even death. Discussed postop protocols, time out of work, need for 6 weeks of lumbar spine precautions to reduce risk of reinjury. Discussed possibility of recurrent disc herniation. Questions were invited and answered. She desires to proceed with surgery to avoid risk of permanent neurologic deficit. She is otherwise young and healthy, we will not require any preoperative clearance. 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 No history of DVT. She is to hold aspirin and anti-inflammatories, which she has not been taking anyway. All questions invited and answered, we will proceed accordingly.  Plan microlumbar decompression L5-S1 left  Cecilie Kicks, PA-C for Dr Tonita Cong 06/23/2022, 10:41 AM

## 2022-06-23 NOTE — H&P (View-Only) (Signed)
[name] is an 36 y.o. female.   Chief Complaint: back and left leg pain, numbness, weakness HPI: Location: left Quality: improving Severity: mild; pain level 1/10 Duration: 2 months; >1 month since last flare up Context: lifting Alleviating Factors: stretching; PT/OT Associated Symptoms: numbness; tingling Previous Surgery: none Prior Imaging: x ray; MRI Previous Injections: none Previous PT: helped a little; dry needling has helped the burning in her left calf Notes: Pt was referred by Dr. Darrelyn Hillock. Reports a fall off of a horse when she was younger, has still been riding horses through the years. She has had left lower extremity sciatic pain on and off for the last 5 years, had difficult pregnancies with large babies, 10 pounds, seem to have pain during and after. Her children are now 52, 86 and 69 years old. Reports chronic tightness in the left leg. Earlier this year she had a bad flareup of sciatic pain and went to PT for that, took about 7 months to fully resolve. She did not have numbness or weakness at that point only pain in the left leg. She is about 1 month out from onset of this most recent severe flareup with associated numbness and weakness. For about a month prior to that she did have some extra tightness but the day of onset she walked into her office and was carrying her wallet and cup and started having severe pain down the left leg. She initially did have some back pain with this as well but that has since improved. Pain radiates into the calf and lateral foot in the S1 distribution and she does report associated burning, numbness and tingling as well as weakness. The burning is some better following PT where she has been doing dry needling and a lot of stretching. The night of onset she reports she could not feel her foot. She did initially have an IM injection of Toradol and steroid at her PCP and did a round of prednisone. 2 weeks ago she had a massage and after that both  legs felt numb but otherwise she has had no right lower extremity symptoms. She is more sore after more activity. 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 She does typically stay very active with [employer] and other workouts however with this severe flareup she has cut back on all of that activity.  Past Medical History:  Diagnosis Date   Asthma    as a child; no longer needs an inhaler   Headache 04/04/2019   migraines once a month with this pregnancy only   PONV (postoperative nausea and vomiting)    vomited with last c section    Past Surgical History:  Procedure Laterality Date   CESAREAN SECTION N/A 06/20/2014   Procedure: CESAREAN SECTION;  Surgeon: Mitchel Honour, DO;  Location: [facility];  Service: Obstetrics;  Laterality: N/A;   CESAREAN SECTION N/A 07/26/2016   Procedure: CESAREAN SECTION;  Surgeon: Mitchel Honour, DO;  Location: [facility];  Service: Obstetrics;  Laterality: N/A;  Repeat edc 08/07/16 nkda request RNFA   CESAREAN SECTION WITH BILATERAL TUBAL LIGATION Bilateral 04/15/2019   Procedure: CESAREAN SECTION WITH BILATERAL TUBAL LIGATION;  Surgeon: Mitchel Honour, DO;  Location: [facility];  Service: Obstetrics;  Laterality: Bilateral;  Repeat edc 05/04/19 NKDA Tracey RNFA   TONSILLECTOMY AND ADENOIDECTOMY     WRIST GANGLION EXCISION     left wrist    Family History  Problem Relation  Age of Onset   Cancer Mother        gallbladder   Cancer Maternal Aunt        breast   Thyroid disease Maternal Aunt    Thyroid disease Paternal Aunt    Social History:  reports that she has never smoked. She has never used smokeless tobacco. She reports that she does not drink alcohol and does not use drugs.  Allergies: No Known Allergies  Current medications: mupirocin 2 % topical ointment predniSONE 10 mg tablet  Review of Systems  Constitutional: Negative.   HENT: Negative.    Eyes: Negative.   Respiratory:  Negative.    Cardiovascular: Negative.   Gastrointestinal: Negative.   Endocrine: Negative.   Genitourinary: Negative.   Musculoskeletal:  Positive for back pain and gait problem.  Allergic/Immunologic: Negative.   Neurological:  Positive for weakness and numbness.    unknown if currently breastfeeding. Physical Exam Constitutional:      Appearance: Normal appearance.  HENT:     Head: Normocephalic and atraumatic.     Right Ear: External ear normal.     Left Ear: External ear normal.     Nose: Nose normal.     Mouth/Throat:     Pharynx: Oropharynx is clear.  Eyes:     Conjunctiva/sclera: Conjunctivae normal.  Cardiovascular:     Rate and Rhythm: Normal rate and regular rhythm.     Pulses: Normal pulses.     Heart sounds: Normal heart sounds.  Pulmonary:     Effort: Pulmonary effort is normal.     Breath sounds: Normal breath sounds.  Abdominal:     General: Bowel sounds are normal.  Musculoskeletal:     Cervical back: Normal range of motion.     Comments: Patient is awake, alert, and oriented 3. Well-nourished and well-developed. Pleasant. Uncomfortable, seated but leaning to the right. No assistive devices.  On examination of the lumbar spine, nontender to palpation through the spinous processes and paraspinous musculature. Nontender bilateral buttocks and over the greater trochanter of the hips. Decreased flexion and extension lumbar spine. Positive seated straight leg raise on the left reproducing back, buttock and leg pain, negative on the right. Trace weakness with dorsiflexion left foot, no other weakness noted. No groin pain with rotation of the hips bilaterally. Decreased Achilles reflex on the left. No clonus present, negative Babinski. Decreased sensation lateral aspect of the left foot S1 distribution. No calf pain or sign of DVT.   Neurological:     Mental Status: She is alert.    2 view lumbar x-rays reviewed flexion and extension ordered, obtained, reviewed  today and prior lumbar x-rays reviewed. Disc degeneration L4-5 and L5-S1. No listhesis noted.  MRI images and report reviewed today by Dr. Shelle Iron. There is a large central and left-sided subarticular disc extrusion L5-S1 with caudal migration causing severe left subarticular zone and lateral recess narrowing prominently impinging and displacing the descending S1 roots on the left side. There is also moderate displacement of the descending left S2 roots. Disc desiccation L4-5 and L5-S1.  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 Discussed the importance of activity modifications, core strengthening and core motion to prevent exacerbations. ADL sheet reviewed and given. We discussed options. Given that  she has failed steroid injected and orally as well as anti-inflammatories and still has ongoing neurologic deficits including numbness and weakness with a large disc herniation would recommend proceeding with lumbar decompression L5-S1 to the left. We discussed the surgery itself as well as risks, complications and alternatives including but not limited to DVT, PE, failure of procedure, need for secondary procedure, CSF leak, anesthesia risk and even death. Discussed postop protocols, time out of work, need for 6 weeks of lumbar spine precautions to reduce risk of reinjury. Discussed possibility of recurrent disc herniation. Questions were invited and answered. She desires to proceed with surgery to avoid risk of permanent neurologic deficit. She is otherwise young and healthy, we will not require any preoperative clearance. 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 No history of DVT. She is to hold aspirin and anti-inflammatories, which she has not been taking anyway. All questions invited and answered, we will proceed accordingly.  Plan microlumbar decompression L5-S1 left  Cecilie Kicks, PA-C for Dr Tonita Cong 06/23/2022, 10:41 AM

## 2022-06-29 NOTE — Pre-Procedure Instructions (Signed)
Surgical Instructions    Your procedure is scheduled on Thursday, July 06, 2022 at 7:30 AM.  Report to [facility] Main Entrance "A" at 5:30 AM A.M., then check in with the Admitting office.  Call this number if you have problems the morning of surgery:  [phone]   If you have any questions prior to your surgery date call [phone]: Open Monday-Friday 8am-4pm  *If you experience any cold or flu symptoms such as cough, fever, chills, shortness of breath, etc. between now and your scheduled surgery, please notify us.*    Remember:  Do not eat after midnight the night before your surgery  You may drink clear liquids until 4:30 AM the morning of your surgery.   Clear liquids allowed are: Water, Non-Citrus Juices (without pulp), Carbonated Beverages, Clear Tea, Black Coffee Only (NO MILK, CREAM OR POWDERED CREAMER of any kind), and Gatorade.    Take these medicines the morning of surgery with A SIP OF WATER:  NONE  As of today, STOP taking any Aspirin (unless otherwise instructed by your surgeon) Aleve, Naproxen, Ibuprofen, Motrin, Advil, Goody's, BC's, all herbal medications, fish oil, and all vitamins.  Please complete your PRE-SURGERY ENSURE that was provided to you by 4:30 AM the morning of surgery.  Please, if able, drink it in one setting. DO NOT SIP.             Do NOT Smoke (Tobacco/Vaping) for 24 hours prior to your procedure.  If you use a CPAP at night, you may bring your mask/headgear for your overnight stay.   Contacts, glasses, piercing's, hearing aid's, dentures or partials may not be worn into surgery, please bring cases for these belongings.    For patients admitted to the hospital, discharge time will be determined by your treatment team.   Patients discharged the day of surgery will not be allowed to drive home, and someone needs to stay with them for 24 hours.  SURGICAL WAITING ROOM VISITATION Patients having surgery or a procedure may have two support  people in the waiting area. Visitors may stay in the waiting area during the procedure and switch out with other visitors if needed. Children under the age of 63 must have an adult accompany them who is not the patient. If the patient needs to stay at the hospital during part of their recovery, the visitor guidelines for inpatient rooms apply.  Please refer to the [facility] website for the visitor guidelines for Inpatients (after your surgery is over and you are in a regular room).    Special instructions:   [facility]- Preparing For Surgery  Before surgery, you can play an important role. Because skin is not sterile, your skin needs to be as free of germs as possible. You can reduce the number of germs on your skin by washing with CHG (chlorahexidine gluconate) Soap before surgery.  CHG is an antiseptic cleaner which kills germs and bonds with the skin to continue killing germs even after washing.    Oral Hygiene is also important to reduce your risk of infection.  Remember - BRUSH YOUR TEETH THE MORNING OF SURGERY WITH YOUR REGULAR TOOTHPASTE  Please do not use if you have an allergy to CHG or antibacterial soaps. If your skin becomes reddened/irritated stop using the CHG.  Do not shave (including legs and underarms) for at least 48 hours prior to first CHG shower. It is OK to shave your face.  Please follow these instructions carefully.   Shower the  NIGHT BEFORE SURGERY and the MORNING OF SURGERY  If you chose to wash your hair, wash your hair first as usual with your normal shampoo.  After you shampoo, rinse your hair and body thoroughly to remove the shampoo.  Use CHG Soap as you would any other liquid soap. You can apply CHG directly to the skin and wash gently with a scrungie or a clean washcloth.   Apply the CHG Soap to your body ONLY FROM THE NECK DOWN.  Do not use on open wounds or open sores. Avoid contact with your eyes, ears, mouth and genitals (private parts). Wash Face  and genitals (private parts)  with your normal soap.   Wash thoroughly, paying special attention to the area where your surgery will be performed.  Thoroughly rinse your body with warm water from the neck down.  DO NOT shower/wash with your normal soap after using and rinsing off the CHG Soap.  Pat yourself dry with a CLEAN TOWEL.  Wear CLEAN PAJAMAS to bed the night before surgery  Place CLEAN SHEETS on your bed the night before your surgery  DO NOT SLEEP WITH PETS.   Day of Surgery: Take a shower with CHG soap. Do not wear jewelry or makeup Do not wear lotions, powders, perfumes/colognes, or deodorant. Do not shave 48 hours prior to surgery. Do not bring valuables to the hospital.  [facility] is not responsible for any belongings or valuables. Do not wear nail polish, gel polish, artificial nails, or any other type of covering on natural nails (fingers and toes) If you have artificial nails or gel coating that need to be removed by a nail salon, please have this removed prior to surgery. Artificial nails or gel coating may interfere with anesthesia's ability to adequately monitor your vital signs. Wear Clean/Comfortable clothing the morning of surgery Do not apply any deodorants/lotions.   Remember to brush your teeth WITH YOUR REGULAR TOOTHPASTE.   Please read over the following fact sheets that you were given.  If you received a COVID test during your pre-op visit  it is requested that you wear a mask when out in public, stay away from anyone that may not be feeling well and notify your surgeon if you develop symptoms. If you have been in contact with anyone that has tested positive in the last 10 days please notify you surgeon.

## 2022-06-30 ENCOUNTER — Other Ambulatory Visit: Payer: Self-pay

## 2022-06-30 ENCOUNTER — Encounter (HOSPITAL_COMMUNITY)
Admission: RE | Admit: 2022-06-30 | Discharge: 2022-06-30 | Disposition: A | Discharge status: Home / Self Care | Payer: BC Managed Care – PPO | Source: Ambulatory Visit | Attending: Orthopedic Surgery | Admitting: Orthopedic Surgery

## 2022-06-30 ENCOUNTER — Ambulatory Visit (HOSPITAL_COMMUNITY)
Admission: RE | Admit: 2022-06-30 | Discharge: 2022-06-30 | Disposition: A | Discharge status: Home / Self Care | Payer: BC Managed Care – PPO | Source: Ambulatory Visit | Attending: Orthopedic Surgery | Admitting: Orthopedic Surgery

## 2022-06-30 ENCOUNTER — Encounter (HOSPITAL_COMMUNITY): Payer: Self-pay

## 2022-06-30 VITALS — BP 152/65 | HR 76 | Temp 97.5°F | Resp 18 | Ht 72.0 in | Wt 311.0 lb

## 2022-06-30 DIAGNOSIS — Z01818 Encounter for other preprocedural examination: Secondary | ICD-10-CM | POA: Insufficient documentation

## 2022-06-30 DIAGNOSIS — M5126 Other intervertebral disc displacement, lumbar region: Secondary | ICD-10-CM | POA: Insufficient documentation

## 2022-06-30 LAB — CBC
HCT: 40.4 % (ref 36.0–46.0)
Hemoglobin: 13.2 g/dL (ref 12.0–15.0)
MCH: 27.5 pg (ref 26.0–34.0)
MCHC: 32.7 g/dL (ref 30.0–36.0)
MCV: 84.2 fL (ref 80.0–100.0)
Platelets: 399 10*3/uL (ref 150–400)
RBC: 4.8 MIL/uL (ref 3.87–5.11)
RDW: 12.8 % (ref 11.5–15.5)
WBC: 8.2 10*3/uL (ref 4.0–10.5)
nRBC: 0 % (ref 0.0–0.2)

## 2022-06-30 LAB — SURGICAL PCR SCREEN
MRSA, PCR: NEGATIVE
Staphylococcus aureus: NEGATIVE

## 2022-06-30 NOTE — Progress Notes (Signed)
PCP - Dr. Sharilyn Sites Cardiologist - Denies  PPM/ICD - Denies  Spine x-ray - 06/30/22 EKG - N/A Stress Test - Denies ECHO - Denies Cardiac Cath - Denies  Sleep Study - Denies  Diabetes: Denies  Blood Thinner Instructions: N/A Aspirin Instructions: N/A  ERAS Protcol - Yes, PRE-SURGERY Ensure  COVID TEST- N/A   Anesthesia review: No  Patient denies shortness of breath, fever, cough and chest pain at PAT appointment   All instructions explained to the patient, with a verbal understanding of the material. Patient agrees to go over the instructions while at home for a better understanding. Patient also instructed to self quarantine after being tested for COVID-19. The opportunity to ask questions was provided.

## 2022-07-05 NOTE — Anesthesia Preprocedure Evaluation (Addendum)
Anesthesia Evaluation  Patient identified by MRN, date of birth, ID band Patient awake    Reviewed: Allergy & Precautions, NPO status , Patient's Chart, lab work & pertinent test results  History of Anesthesia Complications (+) PONV  Airway Mallampati: I  TM Distance: >3 FB Neck ROM: Full    Dental  (+) Dental Advisory Given   Pulmonary neg pulmonary ROS   breath sounds clear to auscultation       Cardiovascular negative cardio ROS  Rhythm:Regular Rate:Normal     Neuro/Psych  Headaches Back pain    GI/Hepatic negative GI ROS, Neg liver ROS,,,  Endo/Other    Morbid obesityBMI 42.2  Renal/GU negative Renal ROS     Musculoskeletal   Abdominal  (+) + obese  Peds  Hematology negative hematology ROS (+)   Anesthesia Other Findings   Reproductive/Obstetrics BTL                              Anesthesia Physical Anesthesia Plan  ASA: 3  Anesthesia Plan: General   Post-op Pain Management: Tylenol PO (pre-op)*   Induction: Intravenous  PONV Risk Score and Plan: 4 or greater and Ondansetron, Dexamethasone and Scopolamine patch - Pre-op  Airway Management Planned: Oral ETT  Additional Equipment: None  Intra-op Plan:   Post-operative Plan: Extubation in OR  Informed Consent: I have reviewed the patients History and Physical, chart, labs and discussed the procedure including the risks, benefits and alternatives for the proposed anesthesia with the patient or authorized representative who has indicated his/her understanding and acceptance.     Dental advisory given  Plan Discussed with: CRNA and Surgeon  Anesthesia Plan Comments:         Anesthesia Quick Evaluation

## 2022-07-06 ENCOUNTER — Other Ambulatory Visit: Payer: Self-pay

## 2022-07-06 ENCOUNTER — Ambulatory Visit (HOSPITAL_COMMUNITY): Payer: BC Managed Care – PPO

## 2022-07-06 ENCOUNTER — Ambulatory Visit (HOSPITAL_COMMUNITY): Payer: BC Managed Care – PPO | Admitting: Anesthesiology

## 2022-07-06 ENCOUNTER — Ambulatory Visit (HOSPITAL_COMMUNITY)
Admission: RE | Disposition: A | Discharge status: Home / Self Care | Payer: Self-pay | Source: Ambulatory Visit | Attending: Specialist

## 2022-07-06 ENCOUNTER — Encounter (HOSPITAL_COMMUNITY): Payer: Self-pay | Admitting: Specialist

## 2022-07-06 ENCOUNTER — Ambulatory Visit (HOSPITAL_COMMUNITY)
Admission: RE | Admit: 2022-07-06 | Discharge: 2022-07-06 | Disposition: A | Discharge status: Home / Self Care | Payer: BC Managed Care – PPO | Source: Ambulatory Visit | Attending: Specialist | Admitting: Specialist

## 2022-07-06 DIAGNOSIS — M5127 Other intervertebral disc displacement, lumbosacral region: Secondary | ICD-10-CM | POA: Diagnosis not present

## 2022-07-06 DIAGNOSIS — Z6839 Body mass index (BMI) 39.0-39.9, adult: Secondary | ICD-10-CM | POA: Diagnosis not present

## 2022-07-06 DIAGNOSIS — M4807 Spinal stenosis, lumbosacral region: Secondary | ICD-10-CM | POA: Insufficient documentation

## 2022-07-06 DIAGNOSIS — M5126 Other intervertebral disc displacement, lumbar region: Secondary | ICD-10-CM | POA: Diagnosis present

## 2022-07-06 HISTORY — PX: LUMBAR LAMINECTOMY/DECOMPRESSION MICRODISCECTOMY: SHX5026

## 2022-07-06 LAB — POCT PREGNANCY, URINE: Preg Test, Ur: NEGATIVE

## 2022-07-06 SURGERY — LUMBAR LAMINECTOMY/DECOMPRESSION MICRODISCECTOMY 1 LEVEL
Anesthesia: General | Site: Back | Laterality: Left

## 2022-07-06 MED ORDER — ONDANSETRON HCL 4 MG PO TABS: 4.0000 mg | ORAL_TABLET | Freq: Four times a day (QID) | ORAL | Status: DC | PRN

## 2022-07-06 MED ORDER — BISACODYL 5 MG PO TBEC: 5.0000 mg | DELAYED_RELEASE_TABLET | Freq: Every day | ORAL | Status: DC | PRN

## 2022-07-06 MED ORDER — ACETAMINOPHEN 10 MG/ML IV SOLN
INTRAVENOUS | Status: AC
  Filled 2022-07-06: qty 100

## 2022-07-06 MED ORDER — OXYCODONE HCL 5 MG PO TABS: 5.0000 mg | ORAL_TABLET | ORAL | Status: DC | PRN

## 2022-07-06 MED ORDER — BUPIVACAINE-EPINEPHRINE (PF) 0.5% -1:200000 IJ SOLN
INTRAMUSCULAR | Status: AC
  Filled 2022-07-06: qty 30

## 2022-07-06 MED ORDER — HYDROMORPHONE HCL 1 MG/ML IJ SOLN
INTRAMUSCULAR | Status: AC
  Filled 2022-07-06: qty 0.5

## 2022-07-06 MED ORDER — POLYETHYLENE GLYCOL 3350 17 G PO PACK: 17.0000 g | PACK | Freq: Every day | ORAL | 0 refills | Status: DC

## 2022-07-06 MED ORDER — ROCURONIUM BROMIDE 10 MG/ML (PF) SYRINGE
PREFILLED_SYRINGE | INTRAVENOUS | Status: DC | PRN
  Administered 2022-07-06: 20 mg via INTRAVENOUS
  Administered 2022-07-06: 60 mg via INTRAVENOUS

## 2022-07-06 MED ORDER — OXYCODONE HCL 10 MG PO TABS: 10.0000 mg | ORAL_TABLET | ORAL | 0 refills | Status: AC | PRN

## 2022-07-06 MED ORDER — MIDAZOLAM HCL 2 MG/2ML IJ SOLN: 0.5000 mg | Freq: Once | INTRAMUSCULAR | Status: DC | PRN

## 2022-07-06 MED ORDER — ACETAMINOPHEN 650 MG RE SUPP: 650.0000 mg | RECTAL | Status: DC | PRN

## 2022-07-06 MED ORDER — SCOPOLAMINE 1 MG/3DAYS TD PT72
MEDICATED_PATCH | TRANSDERMAL | Status: AC
  Administered 2022-07-06: 1.5 mg via TRANSDERMAL
  Filled 2022-07-06: qty 1

## 2022-07-06 MED ORDER — DOCUSATE SODIUM 100 MG PO CAPS
100.0000 mg | ORAL_CAPSULE | Freq: Two times a day (BID) | ORAL | Status: DC
  Administered 2022-07-06: 100 mg via ORAL
  Filled 2022-07-06: qty 1

## 2022-07-06 MED ORDER — PHENOL 1.4 % MT LIQD: 1.0000 | OROMUCOSAL | Status: DC | PRN

## 2022-07-06 MED ORDER — POLYETHYLENE GLYCOL 3350 17 G PO PACK: 17.0000 g | PACK | Freq: Every day | ORAL | Status: DC | PRN

## 2022-07-06 MED ORDER — RISAQUAD PO CAPS
1.0000 | ORAL_CAPSULE | Freq: Every day | ORAL | Status: DC
  Administered 2022-07-06: 1 via ORAL
  Filled 2022-07-06: qty 1

## 2022-07-06 MED ORDER — HYDROMORPHONE HCL 1 MG/ML IJ SOLN: 0.5000 mg | INTRAMUSCULAR | Status: DC | PRN

## 2022-07-06 MED ORDER — ONDANSETRON HCL 4 MG/2ML IJ SOLN
INTRAMUSCULAR | Status: DC | PRN
  Administered 2022-07-06: 4 mg via INTRAVENOUS

## 2022-07-06 MED ORDER — ONDANSETRON HCL 4 MG/2ML IJ SOLN: 4.0000 mg | Freq: Four times a day (QID) | INTRAMUSCULAR | Status: DC | PRN

## 2022-07-06 MED ORDER — ACETAMINOPHEN 500 MG PO TABS
ORAL_TABLET | ORAL | Status: AC
  Filled 2022-07-06: qty 2

## 2022-07-06 MED ORDER — ZINC SULFATE 220 (50 ZN) MG PO CAPS
220.0000 mg | ORAL_CAPSULE | Freq: Every day | ORAL | Status: DC
  Administered 2022-07-06: 220 mg via ORAL
  Filled 2022-07-06: qty 1

## 2022-07-06 MED ORDER — VANCOMYCIN HCL 1500 MG/300ML IV SOLN
1500.0000 mg | Freq: Once | INTRAVENOUS | Status: AC
  Administered 2022-07-06: 1500 mg via INTRAVENOUS
  Filled 2022-07-06: qty 300

## 2022-07-06 MED ORDER — MEPERIDINE HCL 25 MG/ML IJ SOLN: 6.2500 mg | INTRAMUSCULAR | Status: DC | PRN

## 2022-07-06 MED ORDER — LACTATED RINGERS IV SOLN: INTRAVENOUS | Status: DC

## 2022-07-06 MED ORDER — VITAMIN B-12 1000 MCG PO TABS
500.0000 ug | ORAL_TABLET | Freq: Every day | ORAL | Status: DC
  Administered 2022-07-06: 500 ug via ORAL
  Filled 2022-07-06: qty 1

## 2022-07-06 MED ORDER — SCOPOLAMINE 1 MG/3DAYS TD PT72: 1.0000 | MEDICATED_PATCH | TRANSDERMAL | Status: DC

## 2022-07-06 MED ORDER — LIDOCAINE 2% (20 MG/ML) 5 ML SYRINGE
INTRAMUSCULAR | Status: DC | PRN
  Administered 2022-07-06: 40 mg via INTRAVENOUS

## 2022-07-06 MED ORDER — ACETAMINOPHEN 500 MG PO TABS: 1000.0000 mg | ORAL_TABLET | Freq: Once | ORAL | Status: DC

## 2022-07-06 MED ORDER — CEFAZOLIN IN SODIUM CHLORIDE 3-0.9 GM/100ML-% IV SOLN
3.0000 g | INTRAVENOUS | Status: AC
  Administered 2022-07-06: 3 g via INTRAVENOUS

## 2022-07-06 MED ORDER — ADULT MULTIVITAMIN W/MINERALS CH: 1.0000 | ORAL_TABLET | Freq: Every morning | ORAL | Status: DC

## 2022-07-06 MED ORDER — PROPOFOL 10 MG/ML IV BOLUS
INTRAVENOUS | Status: DC | PRN
  Administered 2022-07-06: 200 mg via INTRAVENOUS

## 2022-07-06 MED ORDER — METHOCARBAMOL 750 MG PO TABS: 750.0000 mg | ORAL_TABLET | Freq: Three times a day (TID) | ORAL | 1 refills | Status: DC | PRN

## 2022-07-06 MED ORDER — 0.9 % SODIUM CHLORIDE (POUR BTL) OPTIME
TOPICAL | Status: DC | PRN
  Administered 2022-07-06: 1000 mL

## 2022-07-06 MED ORDER — CHLORHEXIDINE GLUCONATE 0.12 % MT SOLN: 15.0000 mL | Freq: Once | OROMUCOSAL | Status: AC

## 2022-07-06 MED ORDER — ACETAMINOPHEN 10 MG/ML IV SOLN
1000.0000 mg | INTRAVENOUS | Status: AC
  Administered 2022-07-06: 1000 mg via INTRAVENOUS

## 2022-07-06 MED ORDER — MIDAZOLAM HCL 2 MG/2ML IJ SOLN
INTRAMUSCULAR | Status: DC | PRN
  Administered 2022-07-06: 2 mg via INTRAVENOUS

## 2022-07-06 MED ORDER — MENTHOL 3 MG MT LOZG: 1.0000 | LOZENGE | OROMUCOSAL | Status: DC | PRN

## 2022-07-06 MED ORDER — DEXAMETHASONE SODIUM PHOSPHATE 10 MG/ML IJ SOLN
INTRAMUSCULAR | Status: DC | PRN
  Administered 2022-07-06: 10 mg via INTRAVENOUS

## 2022-07-06 MED ORDER — DOCUSATE SODIUM 100 MG PO CAPS: 100.0000 mg | ORAL_CAPSULE | Freq: Two times a day (BID) | ORAL | 1 refills | Status: DC | PRN

## 2022-07-06 MED ORDER — THROMBIN (RECOMBINANT) 20000 UNITS EX SOLR
CUTANEOUS | Status: AC
  Filled 2022-07-06: qty 20000

## 2022-07-06 MED ORDER — ORAL CARE MOUTH RINSE: 15.0000 mL | Freq: Once | OROMUCOSAL | Status: AC

## 2022-07-06 MED ORDER — SUGAMMADEX SODIUM 200 MG/2ML IV SOLN
INTRAVENOUS | Status: DC | PRN
  Administered 2022-07-06: 260 mg via INTRAVENOUS

## 2022-07-06 MED ORDER — HYDROMORPHONE HCL 1 MG/ML IJ SOLN: 0.2500 mg | INTRAMUSCULAR | Status: DC | PRN

## 2022-07-06 MED ORDER — KCL IN DEXTROSE-NACL 20-5-0.45 MEQ/L-%-% IV SOLN
INTRAVENOUS | Status: DC
  Filled 2022-07-06: qty 1000

## 2022-07-06 MED ORDER — PROMETHAZINE HCL 25 MG/ML IJ SOLN
INTRAMUSCULAR | Status: AC
  Filled 2022-07-06: qty 1

## 2022-07-06 MED ORDER — OXYCODONE HCL 5 MG PO TABS: 5.0000 mg | ORAL_TABLET | Freq: Once | ORAL | Status: DC | PRN

## 2022-07-06 MED ORDER — FENTANYL CITRATE (PF) 250 MCG/5ML IJ SOLN
INTRAMUSCULAR | Status: AC
  Filled 2022-07-06: qty 5

## 2022-07-06 MED ORDER — CEFAZOLIN IN SODIUM CHLORIDE 3-0.9 GM/100ML-% IV SOLN
3.0000 g | Freq: Three times a day (TID) | INTRAVENOUS | Status: AC
  Administered 2022-07-06: 3 g via INTRAVENOUS
  Filled 2022-07-06: qty 100

## 2022-07-06 MED ORDER — ALUM & MAG HYDROXIDE-SIMETH 200-200-20 MG/5ML PO SUSP: 30.0000 mL | Freq: Four times a day (QID) | ORAL | Status: DC | PRN

## 2022-07-06 MED ORDER — PROPOFOL 10 MG/ML IV BOLUS
INTRAVENOUS | Status: AC
  Filled 2022-07-06: qty 20

## 2022-07-06 MED ORDER — THROMBIN 20000 UNITS EX SOLR: CUTANEOUS | Status: DC | PRN

## 2022-07-06 MED ORDER — MAGNESIUM CITRATE PO SOLN: 1.0000 | Freq: Once | ORAL | Status: DC | PRN

## 2022-07-06 MED ORDER — CEFAZOLIN SODIUM-DEXTROSE 2-4 GM/100ML-% IV SOLN
INTRAVENOUS | Status: AC
  Filled 2022-07-06: qty 100

## 2022-07-06 MED ORDER — OXYCODONE HCL 5 MG/5ML PO SOLN: 5.0000 mg | Freq: Once | ORAL | Status: DC | PRN

## 2022-07-06 MED ORDER — MIDAZOLAM HCL 2 MG/2ML IJ SOLN
INTRAMUSCULAR | Status: AC
  Filled 2022-07-06: qty 2

## 2022-07-06 MED ORDER — CHLORHEXIDINE GLUCONATE 0.12 % MT SOLN
OROMUCOSAL | Status: AC
  Administered 2022-07-06: 15 mL via OROMUCOSAL
  Filled 2022-07-06: qty 15

## 2022-07-06 MED ORDER — PROMETHAZINE HCL 25 MG/ML IJ SOLN
6.2500 mg | INTRAMUSCULAR | Status: DC | PRN
  Administered 2022-07-06: 6.25 mg via INTRAVENOUS

## 2022-07-06 MED ORDER — BUPIVACAINE-EPINEPHRINE 0.5% -1:200000 IJ SOLN
INTRAMUSCULAR | Status: DC | PRN
  Administered 2022-07-06: 7 mL

## 2022-07-06 MED ORDER — OXYCODONE HCL 5 MG PO TABS: 10.0000 mg | ORAL_TABLET | ORAL | Status: DC | PRN

## 2022-07-06 MED ORDER — METHOCARBAMOL 1000 MG/10ML IJ SOLN: 500.0000 mg | Freq: Four times a day (QID) | INTRAVENOUS | Status: DC | PRN

## 2022-07-06 MED ORDER — FENTANYL CITRATE (PF) 250 MCG/5ML IJ SOLN
INTRAMUSCULAR | Status: DC | PRN
  Administered 2022-07-06 (×3): 50 ug via INTRAVENOUS
  Administered 2022-07-06: 100 ug via INTRAVENOUS

## 2022-07-06 MED ORDER — VANCOMYCIN HCL IN DEXTROSE 1-5 GM/200ML-% IV SOLN
INTRAVENOUS | Status: AC
  Filled 2022-07-06: qty 200

## 2022-07-06 MED ORDER — ACETAMINOPHEN 325 MG PO TABS
650.0000 mg | ORAL_TABLET | ORAL | Status: DC | PRN
  Administered 2022-07-06: 650 mg via ORAL
  Filled 2022-07-06: qty 2

## 2022-07-06 MED ORDER — METHOCARBAMOL 500 MG PO TABS: 500.0000 mg | ORAL_TABLET | Freq: Four times a day (QID) | ORAL | Status: DC | PRN

## 2022-07-06 MED ORDER — HYDROMORPHONE HCL 1 MG/ML IJ SOLN
INTRAMUSCULAR | Status: DC | PRN
  Administered 2022-07-06: .5 mg via INTRAVENOUS

## 2022-07-06 SURGICAL SUPPLY — 52 items
BAG COUNTER SPONGE SURGICOUNT (BAG) ×1 IMPLANT
BAND RUBBER #18 3X1/16 STRL (MISCELLANEOUS) ×2 IMPLANT
BUR EGG ELITE 5.0 (BURR) IMPLANT
BUR RND DIAMOND ELITE 4.0 (BURR) IMPLANT
CLEANER TIP ELECTROSURG 2X2 (MISCELLANEOUS) ×1 IMPLANT
CNTNR URN SCR LID CUP LEK RST (MISCELLANEOUS) ×1 IMPLANT
CONT SPEC 4OZ STRL OR WHT (MISCELLANEOUS) ×1
DRAPE LAPAROTOMY 100X72X124 (DRAPES) ×1 IMPLANT
DRAPE MICROSCOPE SLANT 54X150 (MISCELLANEOUS) ×1 IMPLANT
DRAPE SHEET LG 3/4 BI-LAMINATE (DRAPES) ×1 IMPLANT
DRAPE SURG 17X11 SM STRL (DRAPES) ×1 IMPLANT
DRAPE UTILITY XL STRL (DRAPES) ×1 IMPLANT
DRSG AQUACEL AG ADV 3.5X 4 (GAUZE/BANDAGES/DRESSINGS) IMPLANT
DRSG TELFA 3X8 NADH STRL (GAUZE/BANDAGES/DRESSINGS) IMPLANT
DURAPREP 26ML APPLICATOR (WOUND CARE) ×1 IMPLANT
ELECT BLADE 4.0 EZ CLEAN MEGAD (MISCELLANEOUS)
ELECT REM PT RETURN 9FT ADLT (ELECTROSURGICAL) ×1
ELECTRODE BLDE 4.0 EZ CLN MEGD (MISCELLANEOUS) IMPLANT
ELECTRODE REM PT RTRN 9FT ADLT (ELECTROSURGICAL) ×1 IMPLANT
GLOVE BIOGEL PI IND STRL 7.5 (GLOVE) ×1 IMPLANT
GLOVE SURG SS PI 7.0 STRL IVOR (GLOVE) ×1 IMPLANT
GLOVE SURG SS PI 8.0 STRL IVOR (GLOVE) ×2 IMPLANT
GOWN STRL REUS W/ TWL LRG LVL3 (GOWN DISPOSABLE) ×1 IMPLANT
GOWN STRL REUS W/ TWL XL LVL3 (GOWN DISPOSABLE) ×1 IMPLANT
GOWN STRL REUS W/TWL LRG LVL3 (GOWN DISPOSABLE) ×1
GOWN STRL REUS W/TWL XL LVL3 (GOWN DISPOSABLE) ×1
IV CATH 14GX2 1/4 (CATHETERS) ×1 IMPLANT
KIT BASIN OR (CUSTOM PROCEDURE TRAY) ×1 IMPLANT
NDL 22X1.5 STRL (OR ONLY) (MISCELLANEOUS) ×1 IMPLANT
NDL SPNL 18GX3.5 QUINCKE PK (NEEDLE) ×2 IMPLANT
NEEDLE 22X1.5 STRL (OR ONLY) (MISCELLANEOUS) ×1 IMPLANT
NEEDLE SPNL 18GX3.5 QUINCKE PK (NEEDLE) ×2 IMPLANT
PACK LAMINECTOMY NEURO (CUSTOM PROCEDURE TRAY) ×1 IMPLANT
PATTIES SURGICAL .75X.75 (GAUZE/BANDAGES/DRESSINGS) ×1 IMPLANT
SOLUTION PRONTOSAN WOUND 350ML (IRRIGATION / IRRIGATOR) IMPLANT
SPONGE SURGIFOAM ABS GEL 100 (HEMOSTASIS) ×1 IMPLANT
SPONGE T-LAP 4X18 ~~LOC~~+RFID (SPONGE) IMPLANT
STAPLER VISISTAT (STAPLE) IMPLANT
STRIP CLOSURE SKIN 1/2X4 (GAUZE/BANDAGES/DRESSINGS) ×1 IMPLANT
SUT NURALON 4 0 TR CR/8 (SUTURE) IMPLANT
SUT PROLENE 3 0 PS 2 (SUTURE) IMPLANT
SUT VIC AB 1 CT1 27 (SUTURE) ×1
SUT VIC AB 1 CT1 27XBRD ANTBC (SUTURE) IMPLANT
SUT VIC AB 2-0 CT1 27 (SUTURE) ×1
SUT VIC AB 2-0 CT1 TAPERPNT 27 (SUTURE) IMPLANT
SUT VIC AB 2-0 CT2 27 (SUTURE) IMPLANT
SYR 3ML LL SCALE MARK (SYRINGE) ×1 IMPLANT
TOWEL GREEN STERILE (TOWEL DISPOSABLE) ×1 IMPLANT
TOWEL GREEN STERILE FF (TOWEL DISPOSABLE) ×1 IMPLANT
TRAY FOLEY MTR SLVR 16FR STAT (SET/KITS/TRAYS/PACK) ×1 IMPLANT
WIPE CHG 2% 2PK PREOPERATIVE (MISCELLANEOUS) ×1 IMPLANT
YANKAUER SUCT BULB TIP NO VENT (SUCTIONS) ×1 IMPLANT

## 2022-07-06 NOTE — Op Note (Signed)
[name], [name] MEDICAL RECORD NO: [id number] ACCOUNT NO: [id number] DATE OF BIRTH: 23-Apr-1986 FACILITY: [facility] LOCATION: [facility] PHYSICIAN: Javier Docker, MD  Operative Report   DATE OF PROCEDURE: 07/06/2022  PREOPERATIVE DIAGNOSES: 1.  Spinal stenosis, herniated nucleus pulposus, L5-S1, left. 2.  Elevated body mass index, 39.  POSTOPERATIVE DIAGNOSES: 1.  Spinal stenosis, herniated nucleus pulposus, L5-S1, left. 2.  Elevated body mass index, 39.  PROCEDURE PERFORMED:   1.  Microdiskectomy L5-S1 left. 2.  Hemilaminotomy L5-S1 left with foraminotomy S1 left. 3.  Lysis of epidural venous plexus.  ANESTHESIA:  General.  ASSISTANT:  Andrez Grime, PA  HISTORY:  This is a 36 year old female with left lower extremity radicular pain secondary to large disk herniation L5-S1 refractory to conservative treatment, neural tension signs, diminished repetitive plantarflexion.  She was indicated for  decompression of the S1 nerve root by microdiskectomy.  Risks and benefits discussed including bleeding, infection, damage to neurovascular structures, no change in symptoms, worsening symptoms, DVT, PE, anesthetic complications, etc.  DESCRIPTION OF PROCEDURE:  With the patient in supine position, after induction of adequate general anesthesia, 3 grams Kefzol and 1.5 grams of vancomycin, she was placed prone on the Wilson frame.  All bony prominences were well padded.  Foley to  gravity.  The lumbar region was prepped and draped in the usual sterile fashion.  Two 18-gauge spinal needles utilized to localize L5-S1 interspace, confirmed with x-ray.  Incision was made from the spinous process of L5 to S1.  Subcutaneous tissue was  dissected.  Electrocautery was utilized to achieve hemostasis.  The patient had ample subcutaneous adipose layer.  We used the [facility] retractor to identify the dorsal lumbar fascia, which was divided in line with the skin incision.  We used the  extra-long  retractors and placed a Veterinary surgeon.  A confirmatory radiograph was obtained, identifying L5-S1.  Operating microscope was draped and brought in the surgical field.  First hemilaminotomies in the caudad edge of L5 was performed with 3  mm Kerrison.  Straight curette was utilized to detach ligamentum flavum from the cephalad edge of S1.  I then identified the superior articulating process of S1.  Used a Penfield 4 to develop a plane between the ligamentum flavum and the facet.  With the  neural elements well protected, I decompressed the lateral recess to the medial border of the pedicle.  I removed ligamentum flavum from the interspace with the neural elements well protected with a neuro patty.  Then, with an extra-long D'Errico, I  performed hemilaminotomy of the cephalad edge of S1, performed a foraminotomy of S1.  Extensive epidural venous plexus was noted and cauterized and mobilized.  I identified the disk space and a large disk herniation, migrating caudad. I performed an  annulotomy of the disk and copious portion of disk material was removed from the disk space with a micropituitary, further mobilized with an Epstein and a nerve hook with multiple fragments excised.  I then looked distal to the disk space behind S1.  I  used a Woodson probe, pushed to displace annulus to the disk space, further mobilizing disk material, which was retrieved with micropituitary.  The S1 nerve root which was compressed in the lateral recess was mobilized after the disk herniation at least  a cm medial to the pedicle without tension.  I then checked beneath thecal sac, shoulder of the root, the axilla of the root out the foramen of L5 and S1.  There was no residual disk  herniation that I was able to determine.  A confirmatory radiograph was  obtained with a Penfield in the disk space.  Following this, I copiously irrigated the disk space with catheter lavage and then retrieved two additional small fragments.   Following this, bone wax was placed on the cancellous surfaces.  Copious  irrigation and then following that, no evidence of active bleeding or CSF leakage.  I placed thrombin-soaked Gelfoam in the laminotomy defect and then removed it.  I used a curette to smooth the laminotomies that were performed.  Next, I removed the  [facility] retractor, inspected the paraspinous musculature.  Any active bleeding was cauterized.  I copiously irrigated the paraspinous musculature as well.  I then closed the dorsal lumbar fascia with 1 Vicryl interrupted figure-of-eight sutures, the  subcutaneous with multiple layers of 2-0 and then the skin with subcuticular Prolene.  Sterile dressing applied.  Placed supine on the hospital bed, extubated without difficulty and transported to the recovery room in satisfactory condition.  The patient tolerated the procedure well.  No complications.  Assistant, Andrez Grime, [place], was used throughout the case for patient positioning, intermittent neural retraction and closure.  Technical difficulty increased due to the patient's elevated BMI.   SHW D: 07/06/2022 11:18:12 am T: 07/06/2022 11:37:00 pm  JOB: [id number]/ [id number]

## 2022-07-06 NOTE — Progress Notes (Signed)
Patient alert and oriented, void, surgical site clean and dry no sign of infection, VSS. D/c instruction explain and given to the patient all questions answered. Pt. D/c home per order.

## 2022-07-06 NOTE — Evaluation (Signed)
Physical Therapy Evaluation & Discharge Patient Details Name: [name] MRN: [id number] DOB: 12-11-1985 Today's Date: 07/06/2022  History of Present Illness  Pt is a 36 y.o. female who presented 07/06/22 for microlumbar decompression L5-S1. PMH: asthma  Clinical Impression  Pt presents with condition above. PTA, she was working at a International aid/development worker, independent without DME, and living with her family in a 1-level house with 3 STE. Pt appears to be at her baseline, demonstrating smooth and steady gait at an appropriate speed without LOB or need for assistance. She does demonstrate some very mild L anterior tibialis weakness compared to her R, but it does not appear to affect her functionally. In addition, she reports some numbness is still present at her dorsal and lateral aspects of her L foot, but reports it is slightly improved since surgery. All education completed and questions answered. PT will sign off. Thank you for this referral.       Recommendations for follow up therapy are one component of a multi-disciplinary discharge planning process, led by the attending physician.  Recommendations may be updated based on patient status, additional functional criteria and insurance authorization.  Follow Up Recommendations No PT follow up      Assistance Recommended at Discharge PRN  Patient can return home with the following  Assistance with cooking/housework    Equipment Recommendations None recommended by PT  Recommendations for Other Services       Functional Status Assessment Patient has not had a recent decline in their functional status     Precautions / Restrictions Precautions Precautions: Back Precaution Booklet Issued: Yes (comment) Precaution Comments: reviewed back precautions, needs cues for compliance intermittently Required Braces or Orthoses:  (no brace needed orders) Restrictions Weight Bearing Restrictions: No      Mobility  Bed Mobility                General bed mobility comments: Pt sitting up in recliner upon arrival.    Transfers Overall transfer level: Independent Equipment used: None               General transfer comment: Able to come to stand safely from recliner without assistance or LOB    Ambulation/Gait Ambulation/Gait assistance: Independent Gait Distance (Feet): 300 Feet Assistive device: None Gait Pattern/deviations: WFL(Within Functional Limits) Gait velocity: WFL Gait velocity interpretation: >4.37 ft/sec, indicative of normal walking speed   General Gait Details: No significant gait deviations. Ambulates smoothly at an appropriate speed without LOB or need for UE support or assistance.  Stairs Stairs: Yes Stairs assistance: Modified independent (Device/Increase time) Stair Management: One rail Right, Alternating pattern, Forwards Number of Stairs: 3 General stair comments: Ascends and descends using 1 handrail for support, no LOB, mod I  Wheelchair Mobility    Modified Rankin (Stroke Patients Only)       Balance Overall balance assessment: No apparent balance deficits (not formally assessed)                                           Pertinent Vitals/Pain Pain Assessment Pain Assessment: Faces Faces Pain Scale: Hurts a little bit Pain Location: L foot Pain Descriptors / Indicators: Numbness Pain Intervention(s): Limited activity within patient's tolerance, Monitored during session    Home Living Family/patient expects to be discharged to:: Private residence Living Arrangements: Spouse/significant other;Children Available Help at Discharge: Family;Available 24 hours/day Type of Home: House  Home Access: Stairs to enter Entrance Stairs-Rails: Right;Left Entrance Stairs-Number of Steps: 3   Home Layout: One level Home Equipment: Shower seat - built in;Grab bars - tub/shower      Prior Function Prior Level of Function : Independent/Modified  Independent;Driving;Working/employed               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     Hand Dominance        Extremity/Trunk Assessment   Upper Extremity Assessment Upper Extremity Assessment: Overall WFL for tasks assessed    Lower Extremity Assessment Lower Extremity Assessment: LLE deficits/detail LLE Deficits / Details: Slight weakness noted in anterior tibialis with MMT score of 4+ with ankle dorsiflexion on L compared to MMT score of 5 on R; slight numbness (noted improved though since surgery) dorsal and lateral aspect of foot, intact medial aspect of foot and superiorly; coordination intact LLE Sensation: decreased light touch LLE Coordination: WNL    Cervical / Trunk Assessment Cervical / Trunk Assessment: Back Surgery  Communication   Communication: No difficulties  Cognition Arousal/Alertness: Awake/alert Behavior During Therapy: WFL for tasks assessed/performed Overall Cognitive Status: Within Functional Limits for tasks assessed                                 General Comments: Needs cues for spinal precautions compliance intermittently.        General Comments General comments (skin integrity, edema, etc.): educated pt verbally how to donn/doff back brace if she chooses to get one to remind her of her spinal precautions; educated pt and husband on car transfers and donning of shoes/socks while maintaining spinal precautions; educated her verbally on exercises to increase ankle dorsiflexion strength    Exercises     Assessment/Plan    PT Assessment Patient does not need any further PT services  PT Problem List         PT Treatment Interventions      PT Goals (Current goals can be found in the Care Plan section)  Acute Rehab PT Goals Patient Stated Goal: to improve her L leg pain PT Goal Formulation: All assessment and education complete, DC therapy Time For Goal Achievement: 07/07/22 Potential to Achieve Goals: Good     Frequency       Co-evaluation               AM-PAC PT "6 Clicks" Mobility  Outcome Measure Help needed turning from your back to your side while in a flat bed without using bedrails?: None Help needed moving from lying on your back to sitting on the side of a flat bed without using bedrails?: None Help needed moving to and from a bed to a chair (including a wheelchair)?: None Help needed standing up from a chair using your arms (e.g., wheelchair or bedside chair)?: None Help needed to walk in hospital room?: None Help needed climbing 3-5 steps with a railing? : None 6 Click Score: 24    End of Session   Activity Tolerance: Patient tolerated treatment well Patient left: in chair;with call bell/phone within reach;with family/visitor present Nurse Communication: Mobility status PT Visit Diagnosis: Muscle weakness (generalized) (M62.81);Pain Pain - Right/Left: Left Pain - part of body: Leg    Time: 3762-8315 PT Time Calculation (min) (ACUTE ONLY): 14 min   Charges:   PT Evaluation $PT Eval Low Complexity: 1 Low          Jya Hughston Pettis, PT,  DPT Acute Rehabilitation Services  Office: [phone]   Jewel Baize 07/06/2022, 4:39 PM

## 2022-07-06 NOTE — Anesthesia Postprocedure Evaluation (Signed)
Anesthesia Post Note  Patient: [name]  Procedure(s) Performed: Microlumbar decompression L5-S1 left (Left: Back)     Patient location during evaluation: PACU Anesthesia Type: General Level of consciousness: awake and alert, patient cooperative and oriented Pain management: pain level controlled Vital Signs Assessment: post-procedure vital signs reviewed and stable Respiratory status: spontaneous breathing, nonlabored ventilation and respiratory function stable Cardiovascular status: blood pressure returned to baseline and stable Postop Assessment: no apparent nausea or vomiting Anesthetic complications: no   No notable events documented.  Last Vitals:  Vitals:   07/06/22 1210 07/06/22 1229  BP: 104/60 (!) 140/74  Pulse: 75 83  Resp: 14 18  Temp: 36.6 C   SpO2: 96% 100%    Last Pain:  Vitals:   07/06/22 1210  TempSrc:   PainSc: 3                  Prairie Stenberg,E. Rickita Forstner

## 2022-07-06 NOTE — Interval H&P Note (Signed)
History and Physical Interval Note:  07/06/2022 7:28 AM  [name]  has presented today for surgery, with the diagnosis of Herniated disc L5-S1 left.  The various methods of treatment have been discussed with the patient and family. After consideration of risks, benefits and other options for treatment, the patient has consented to  Procedure(s) with comments: Microlumbar decompression L5-S1 left (Left) - 2 hrs 3 C-bed as a surgical intervention.  The patient's history has been reviewed, patient examined, no change in status, stable for surgery.  I have reviewed the patient's chart and labs.  Questions were answered to the patient's satisfaction.   Decreased sensation S1. PF 4/5  Javier Docker

## 2022-07-06 NOTE — Transfer of Care (Signed)
Immediate Anesthesia Transfer of Care Note  Patient: MAKI HEGE  Procedure(s) Performed: Microlumbar decompression L5-S1 left (Left: Back)  Patient Location: PACU  Anesthesia Type:General  Level of Consciousness: drowsy and patient cooperative  Airway & Oxygen Therapy: Patient Spontanous Breathing  Post-op Assessment: Report given to RN, Post -op Vital signs reviewed and stable, and Patient moving all extremities  Post vital signs: Reviewed and stable  Last Vitals:  Vitals Value Taken Time  BP 118/72 07/06/22 1107  Temp    Pulse 91 07/06/22 1109  Resp 19 07/06/22 1109  SpO2 95 % 07/06/22 1109  Vitals shown include unvalidated device data.  Last Pain:  Vitals:   07/06/22 0614  TempSrc: Oral  PainSc:          Complications: No notable events documented.

## 2022-07-06 NOTE — Brief Op Note (Signed)
07/06/2022  7:29 AM  PATIENT:  [name]  36 y.o. female  PRE-OPERATIVE DIAGNOSIS:  Herniated disc L5-S1 left  POST-OPERATIVE DIAGNOSIS:  * No post-op diagnosis entered *  PROCEDURE:  Procedure(s) with comments: Microlumbar decompression L5-S1 left (Left) - 2 hrs 3 C-bed  SURGEON:  Surgeon(s) and Role:    Jene Every, MD - Primary  PHYSICIAN ASSISTANT:   ASSISTANTS: Bissell   ANESTHESIA:   general  EBL:  20   BLOOD ADMINISTERED:none  DRAINS: none   LOCAL MEDICATIONS USED:  MARCAINE     SPECIMEN:  No Specimen  DISPOSITION OF SPECIMEN:  N/A  COUNTS:  YES  TOURNIQUET:   DICTATION: .Other Dictation: Dictation Number [id number]     PLAN OF CARE: Admit for observation.  PATIENT DISPOSITION:  PACU - hemodynamically stable.   Delay start of Pharmacological VTE agent (>24hrs) due to surgical blood loss or risk of bleeding: yes

## 2022-07-06 NOTE — Discharge Instructions (Signed)

## 2022-07-06 NOTE — Plan of Care (Signed)

## 2022-07-06 NOTE — Anesthesia Procedure Notes (Signed)
Procedure Name: Intubation Date/Time: 07/06/2022 7:57 AM  Performed by: Amadeo Garnet, CRNAPre-anesthesia Checklist: Patient identified, Emergency Drugs available, Suction available and Patient being monitored Patient Re-evaluated:Patient Re-evaluated prior to induction Oxygen Delivery Method: Circle system utilized Preoxygenation: Pre-oxygenation with 100% oxygen Induction Type: IV induction Ventilation: Mask ventilation without difficulty Laryngoscope Size: Mac and 4 Grade View: Grade II Tube type: Oral Tube size: 7.0 mm Number of attempts: 1 Airway Equipment and Method: Stylet and Oral airway Placement Confirmation: ETT inserted through vocal cords under direct vision, positive ETCO2 and breath sounds checked- equal and bilateral Secured at: 22 cm Tube secured with: Tape Dental Injury: Teeth and Oropharynx as per pre-operative assessment

## 2022-07-07 ENCOUNTER — Encounter (HOSPITAL_COMMUNITY): Payer: Self-pay | Admitting: Specialist

## 2022-07-07 MED FILL — Thrombin (Recombinant) For Soln 20000 Unit: CUTANEOUS | Qty: 1 | Status: AC

## 2022-08-31 ENCOUNTER — Other Ambulatory Visit: Payer: Self-pay | Admitting: Obstetrics & Gynecology

## 2022-08-31 DIAGNOSIS — R928 Other abnormal and inconclusive findings on diagnostic imaging of breast: Secondary | ICD-10-CM

## 2022-09-08 ENCOUNTER — Ambulatory Visit
Admission: RE | Admit: 2022-09-08 | Discharge: 2022-09-08 | Disposition: A | Discharge status: Home / Self Care | Payer: BC Managed Care – PPO | Source: Ambulatory Visit | Attending: Obstetrics & Gynecology | Admitting: Obstetrics & Gynecology

## 2022-09-08 ENCOUNTER — Ambulatory Visit: Payer: BC Managed Care – PPO

## 2022-09-08 DIAGNOSIS — R928 Other abnormal and inconclusive findings on diagnostic imaging of breast: Secondary | ICD-10-CM

## 2022-11-06 DIAGNOSIS — M545 Low back pain, unspecified: Secondary | ICD-10-CM | POA: Diagnosis not present

## 2023-01-12 DIAGNOSIS — M5136 Other intervertebral disc degeneration, lumbar region: Secondary | ICD-10-CM | POA: Diagnosis not present

## 2023-02-28 DIAGNOSIS — E782 Mixed hyperlipidemia: Secondary | ICD-10-CM | POA: Diagnosis not present

## 2023-02-28 DIAGNOSIS — E559 Vitamin D deficiency, unspecified: Secondary | ICD-10-CM | POA: Diagnosis not present

## 2023-02-28 DIAGNOSIS — Z6841 Body Mass Index (BMI) 40.0 and over, adult: Secondary | ICD-10-CM | POA: Diagnosis not present

## 2023-05-21 DIAGNOSIS — H20012 Primary iridocyclitis, left eye: Secondary | ICD-10-CM | POA: Diagnosis not present

## 2023-05-22 ENCOUNTER — Emergency Department (HOSPITAL_BASED_OUTPATIENT_CLINIC_OR_DEPARTMENT_OTHER): Payer: BC Managed Care – PPO

## 2023-05-22 ENCOUNTER — Encounter (HOSPITAL_BASED_OUTPATIENT_CLINIC_OR_DEPARTMENT_OTHER): Payer: Self-pay

## 2023-05-22 ENCOUNTER — Observation Stay (HOSPITAL_COMMUNITY): Payer: BC Managed Care – PPO

## 2023-05-22 ENCOUNTER — Inpatient Hospital Stay (HOSPITAL_BASED_OUTPATIENT_CLINIC_OR_DEPARTMENT_OTHER)
Admission: EM | Admit: 2023-05-22 | Discharge: 2023-05-25 | DRG: 123 | Disposition: A | Discharge status: Home / Self Care | Payer: BC Managed Care – PPO | Attending: Internal Medicine | Admitting: Internal Medicine

## 2023-05-22 ENCOUNTER — Other Ambulatory Visit: Payer: Self-pay

## 2023-05-22 DIAGNOSIS — H40052 Ocular hypertension, left eye: Secondary | ICD-10-CM | POA: Diagnosis not present

## 2023-05-22 DIAGNOSIS — Z79899 Other long term (current) drug therapy: Secondary | ICD-10-CM | POA: Diagnosis not present

## 2023-05-22 DIAGNOSIS — H469 Unspecified optic neuritis: Secondary | ICD-10-CM | POA: Diagnosis not present

## 2023-05-22 DIAGNOSIS — R29818 Other symptoms and signs involving the nervous system: Secondary | ICD-10-CM | POA: Diagnosis not present

## 2023-05-22 DIAGNOSIS — H532 Diplopia: Secondary | ICD-10-CM | POA: Diagnosis not present

## 2023-05-22 DIAGNOSIS — R0602 Shortness of breath: Secondary | ICD-10-CM | POA: Diagnosis not present

## 2023-05-22 DIAGNOSIS — Z98891 History of uterine scar from previous surgery: Secondary | ICD-10-CM | POA: Diagnosis not present

## 2023-05-22 DIAGNOSIS — H468 Other optic neuritis: Secondary | ICD-10-CM | POA: Diagnosis not present

## 2023-05-22 DIAGNOSIS — E042 Nontoxic multinodular goiter: Secondary | ICD-10-CM | POA: Diagnosis not present

## 2023-05-22 DIAGNOSIS — Z6836 Body mass index (BMI) 36.0-36.9, adult: Secondary | ICD-10-CM

## 2023-05-22 DIAGNOSIS — R519 Headache, unspecified: Secondary | ICD-10-CM | POA: Diagnosis not present

## 2023-05-22 DIAGNOSIS — H209 Unspecified iridocyclitis: Secondary | ICD-10-CM | POA: Diagnosis not present

## 2023-05-22 DIAGNOSIS — J45909 Unspecified asthma, uncomplicated: Secondary | ICD-10-CM | POA: Diagnosis not present

## 2023-05-22 DIAGNOSIS — E669 Obesity, unspecified: Secondary | ICD-10-CM | POA: Diagnosis present

## 2023-05-22 DIAGNOSIS — H20012 Primary iridocyclitis, left eye: Secondary | ICD-10-CM | POA: Diagnosis not present

## 2023-05-22 LAB — C-REACTIVE PROTEIN: CRP: <0.5 mg/dL (ref <1.0)

## 2023-05-22 LAB — CBC
HCT: 39.4 % (ref 36.0–46.0)
Hemoglobin: 13.1 g/dL (ref 12.0–15.0)
MCH: 27.6 pg (ref 26.0–34.0)
MCHC: 33.2 g/dL (ref 30.0–36.0)
MCV: 82.9 fL (ref 80.0–100.0)
Platelets: 358 10*3/uL (ref 150–400)
RBC: 4.75 MIL/uL (ref 3.87–5.11)
RDW: 13.2 % (ref 11.5–15.5)
WBC: 8.4 10*3/uL (ref 4.0–10.5)
nRBC: 0 % (ref 0.0–0.2)

## 2023-05-22 LAB — BASIC METABOLIC PANEL
Anion gap: 6 (ref 5–15)
BUN: 7 mg/dL (ref 6–20)
CO2: 27 mmol/L (ref 22–32)
Calcium: 9.4 mg/dL (ref 8.9–10.3)
Chloride: 106 mmol/L (ref 98–111)
Creatinine, Ser: 0.68 mg/dL (ref 0.44–1.00)
GFR, Estimated: >60 mL/min (ref >60)
Glucose, Bld: 88 mg/dL (ref 70–99)
Potassium: 4 mmol/L (ref 3.5–5.1)
Sodium: 139 mmol/L (ref 135–145)

## 2023-05-22 LAB — SEDIMENTATION RATE: Sed Rate: 27 mm/hr — ABNORMAL HIGH (ref 0–22)

## 2023-05-22 LAB — HIV ANTIBODY (ROUTINE TESTING W REFLEX): HIV Screen 4th Generation wRfx: NONREACTIVE

## 2023-05-22 MED ORDER — LIDOCAINE HCL 2 % IJ SOLN
10.0000 mL | Freq: Once | INTRAMUSCULAR | Status: AC
  Administered 2023-05-22: 200 mg

## 2023-05-22 MED ORDER — ACETAMINOPHEN 325 MG PO TABS
650.0000 mg | ORAL_TABLET | Freq: Four times a day (QID) | ORAL | Status: DC | PRN
  Administered 2023-05-22 – 2023-05-25 (×8): 650 mg via ORAL
  Filled 2023-05-22 (×8): qty 2

## 2023-05-22 MED ORDER — HEPARIN SODIUM (PORCINE) 5000 UNIT/ML IJ SOLN
5000.0000 [IU] | Freq: Three times a day (TID) | INTRAMUSCULAR | Status: DC
  Administered 2023-05-22 – 2023-05-24 (×5): 5000 [IU] via SUBCUTANEOUS
  Filled 2023-05-22 (×6): qty 1

## 2023-05-22 MED ORDER — ACETAMINOPHEN 650 MG RE SUPP: 650.0000 mg | Freq: Four times a day (QID) | RECTAL | Status: DC | PRN

## 2023-05-22 MED ORDER — IOHEXOL 300 MG/ML  SOLN
100.0000 mL | Freq: Once | INTRAMUSCULAR | Status: AC | PRN
  Administered 2023-05-22: 75 mL via INTRAVENOUS

## 2023-05-22 MED ORDER — IOHEXOL 350 MG/ML SOLN
75.0000 mL | Freq: Once | INTRAVENOUS | Status: AC | PRN
  Administered 2023-05-22: 75 mL via INTRAVENOUS

## 2023-05-22 MED ORDER — ONDANSETRON HCL 4 MG/2ML IJ SOLN: 4.0000 mg | Freq: Four times a day (QID) | INTRAMUSCULAR | Status: DC | PRN

## 2023-05-22 MED ORDER — SODIUM CHLORIDE 0.9 % IV BOLUS
1000.0000 mL | Freq: Once | INTRAVENOUS | Status: AC
  Administered 2023-05-22: 1000 mL via INTRAVENOUS

## 2023-05-22 MED ORDER — ATROPINE SULFATE 1 % OP SOLN
2.0000 [drp] | Freq: Every day | OPHTHALMIC | Status: DC
  Administered 2023-05-23: 2 [drp] via OPHTHALMIC
  Filled 2023-05-22: qty 2

## 2023-05-22 MED ORDER — GADOBUTROL 1 MMOL/ML IV SOLN
10.0000 mL | Freq: Once | INTRAVENOUS | Status: AC | PRN
  Administered 2023-05-22: 10 mL via INTRAVENOUS

## 2023-05-22 MED ORDER — SENNOSIDES-DOCUSATE SODIUM 8.6-50 MG PO TABS: 1.0000 | ORAL_TABLET | Freq: Every evening | ORAL | Status: DC | PRN

## 2023-05-22 MED ORDER — ONDANSETRON HCL 4 MG PO TABS: 4.0000 mg | ORAL_TABLET | Freq: Four times a day (QID) | ORAL | Status: DC | PRN

## 2023-05-22 NOTE — ED Provider Notes (Signed)
  Physical Exam  BP (!) 156/104   Pulse 80   Temp (!) 97.1 F (36.2 C) (Oral)   Resp 17   Ht 6' (1.829 m)   Wt 122.5 kg   LMP 07/05/2022 (Exact Date)   SpO2 100%   BMI 36.62 kg/m   Physical Exam  Procedures  .Lumbar Puncture  Date/Time: 05/22/2023 5:47 PM  Performed by: Charlynne Pander, MD Authorized by: Charlynne Pander, MD   Consent:    Consent obtained:  Verbal and written   Consent given by:  Patient   Risks, benefits, and alternatives were discussed: yes     Risks discussed:  Pain   Alternatives discussed:  No treatment Universal protocol:    Patient identity confirmed:  Verbally with patient Pre-procedure details:    Procedure purpose:  Diagnostic   Preparation: Patient was prepped and draped in usual sterile fashion   Anesthesia:    Anesthesia method:  Local infiltration   Local anesthetic:  Lidocaine 2% w/o epi Procedure details:    Lumbar space:  L4-L5 interspace   Patient position:  Sitting   Needle gauge:  22   Needle type:  Diamond point   Needle length (in):  5.0   Ultrasound guidance: no     Number of attempts:  2 Post-procedure details:    Puncture site:  Adhesive bandage applied Comments:     Unsuccessful LP   ED Course / MDM   Clinical Course as of 05/22/23 1745  Tue May 22, 2023  1514 Case discussed with Maralyn Sago reports, patient's ophthalmologist [phone].  Who noted visual acuity and retina are all within normal limits and signs of uveitis improved.  Given patient's vertical diplopia was concern for possible intracranial pathology. [TY]  1523 CT Orbits W Contrast MPRESSION: Normal CT appearance of the globes and orbits with no acute finding.   [TY]  1523 CT Head Wo Contrast IMPRESSION: No acute intracranial findings.   [TY]  [id number] Spoke with Dr. Iver Nestle with neurology; recommending CTA and CTV head and transfer to [facility] was along for MRI brain and orbits with and without contrast. [TY]  1556 Care signed out to Dr. Silverio Lay.   [TY]    Clinical Course User Index [TY] Coral Spikes, DO   Medical Decision Making Care assumed at 3 PM.  Patient is here with diplopia.  Patient also had uveitis.  CT was unremarkable.  Consulted Dr. Iver Nestle from neurology who recommend MRI and CT PE and CTA as well as lumbar puncture as part of workup for uveitis.  5:46 PM I attempted lumbar puncture but was unsuccessful.  At this point, patient will be admitted to get IR guided lumbar puncture as well as CTA and CTV.   Problems Addressed: Diplopia: acute illness or injury  Amount and/or Complexity of Data Reviewed Labs: ordered. Radiology: ordered. Decision-making details documented in ED Course.  Risk Prescription drug management. Decision regarding hospitalization.          Charlynne Pander, MD 05/22/23 [phone]

## 2023-05-22 NOTE — ED Triage Notes (Signed)
In for eval of headache onset Saturday night and woke up Sunday double vision and photosensitivity. Was seen by eye dr yesterday - diag uveitis. Retinal exam normal. Rx for steroid eye drops and atropine drops. Rechecked at eye dr this am, uveitis 90% improved but still has double vision. Slight nausea intermittent. Denies vomiting.

## 2023-05-22 NOTE — H&P (Signed)
PCP:   Assunta Found, MD   Chief Complaint:  Uveitis  HPI: [name] with no significant past medical history.  To the morning she woke up with her vision being off.  She has light sensitivity and double vision.  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  Exam is otherwise normal she was started on atropine and steroid drops.  Patient was rechecked today, her uveitis was with 90% better but her double vision remained.  He was sent [facility] for further w/u. Patient states approximately 3 weeks ago developed some congestion. The left side of her face was puffy. She took a Z pack and her symptoms resolved.  At  [facility] CTA head and neck, CT orbits and CT head were done. Neurology was consulted to r/o other etiologies. Patient transferred for MRI brain and MRI orbits with and without contrast and LP.   Review of Systems:  Per HPI  Past Medical History: Past Medical History:  Diagnosis Date   Asthma    as a child; no longer needs an inhaler   Headache 04/04/2019   migraines once a month with this pregnancy only   PONV (postoperative nausea and vomiting)    vomited with last c section   Past Surgical History:  Procedure Laterality Date   BILATERAL SALPINGECTOMY Bilateral 2020   CESAREAN SECTION N/A 06/20/2014   Procedure: CESAREAN SECTION;  Surgeon: Mitchel Honour, DO;  Location: [facility] ORS;  Service: Obstetrics;  Laterality: N/A;   CESAREAN SECTION N/A 07/26/2016   Procedure: CESAREAN SECTION;  Surgeon: Mitchel Honour, DO;  Location: [facility];  Service: Obstetrics;  Laterality: N/A;  Repeat edc 08/07/16 nkda request RNFA   CESAREAN SECTION WITH BILATERAL TUBAL LIGATION Bilateral 04/15/2019   Procedure: CESAREAN SECTION WITH BILATERAL TUBAL LIGATION;  Surgeon: Mitchel Honour, DO;  Location: [facility] LD ORS;  Service: Obstetrics;  Laterality: Bilateral;  Repeat edc 05/04/19 NKDA Tracey RNFA   LUMBAR LAMINECTOMY/DECOMPRESSION MICRODISCECTOMY  Left 07/06/2022   Procedure: Microlumbar decompression L5-S1 left;  Surgeon: Jene Every, MD;  Location: [facility] OR;  Service: Orthopedics;  Laterality: Left;  2 hrs 3 C-bed   TONSILLECTOMY AND ADENOIDECTOMY     WRIST GANGLION EXCISION     left wrist    Medications: Prior to Admission medications   Medication Sig Start Date End Date Taking? Authorizing Provider  atropine 1 % ophthalmic solution Place 2 drops into both eyes daily. 05/21/23  Yes [provider]  Cyanocobalamin (VITAMIN B-12 PO) Take 1 tablet by mouth daily with lunch.   Yes [provider]  Difluprednate 0.05 % EMUL Apply 1 drop to eye every other day. 05/21/23  Yes [provider]  Multiple Vitamin (MULTIVITAMIN WITH MINERALS) TABS tablet Take 1 tablet by mouth in the morning.   Yes [provider]  Multiple Vitamins-Minerals (ZINC PO) Take 1 tablet by mouth daily with lunch.   Yes [provider]    Allergies:  No Known Allergies  Social History:  reports that she has never smoked. She has never used smokeless tobacco. She reports that she does not drink alcohol and does not use drugs.  Family History: Family History  Problem Relation Age of Onset   Cancer Mother        gallbladder   Cancer Maternal Aunt        breast   Thyroid disease Maternal Aunt    Thyroid disease Paternal Aunt     Physical Exam: Vitals:  05/22/23 1710 05/22/23 1721 05/22/23 1748 05/22/23 1949  BP: (!) 156/104   136/75  Pulse: 72 80  69  Resp: 16 17  18   Temp:   98 F (36.7 C) (!) 97.5 F (36.4 C)  TempSrc:   Oral Oral  SpO2: 100% 100%  100%  Weight:      Height:        General:  A&Ox3, well developed and nourished, no acute distress Eyes: Pink conjunctiva, no scleral icterus ENT: Moist oral mucosa Lungs: clear to ascultation, no wheeze, no crackles, no use of accessory muscles Cardiovascular: regular rate and rhythm, no murmurs. No JVD Abdomen: soft, positive BS, non-tender,  non-distended, no organomegaly GU: not examined Neuro: CN II - XII grossly intact, sensation intact Musculoskeletal: strength 5/5 all extremities, no edema Skin: no rash, no subcutaneous crepitation, no decubitus Psych: appropriate patient   Labs on Admission:  Recent Labs    05/22/23 1159  NA 139  K 4.0  CL 106  CO2 27  GLUCOSE 88  BUN 7  CREATININE 0.68  CALCIUM 9.4    Recent Labs    05/22/23 1159  WBC 8.4  HGB 13.1  HCT 39.4  MCV 82.9  PLT 358    Radiological Exams on Admission: CT VENOGRAM HEAD  Result Date: 05/22/2023 CLINICAL DATA:  AVM/AVF, high-flow vascular malformation; Dural venous sinus thrombosis suspected. Headache. Vision changes. EXAM: CT ANGIOGRAPHY HEAD AND NECK WITH AND WITHOUT CONTRAST CT VENOGRAM HEAD TECHNIQUE: Multidetector CT imaging of the head and neck was performed using the standard protocol during bolus administration of intravenous contrast. Multiplanar CT image reconstructions and MIPs were obtained to evaluate the vascular anatomy. Carotid stenosis measurements (when applicable) are obtained utilizing NASCET criteria, using the distal internal carotid diameter as the denominator. Venographic phase images of the brain were obtained following the administration of intravenous contrast. Multiplanar reformats and maximum intensity projections were generated. RADIATION DOSE REDUCTION: This exam was performed according to the departmental dose-optimization program which includes automated exposure control, adjustment of the mA and/or kV according to patient size and/or use of iterative reconstruction technique. CONTRAST:  75mL OMNIPAQUE IOHEXOL 350 MG/ML SOLN COMPARISON:  Same day head CT. FINDINGS: CTA NECK FINDINGS Aortic arch: Great vessel origins are patent without significant stenosis. Right carotid system: No evidence of dissection, stenosis (50% or greater), or occlusion. Left carotid system: No evidence of dissection, stenosis (50% or greater), or  occlusion. Vertebral arteries: Codominant. No evidence of dissection, stenosis (50% or greater), or occlusion. Skeleton: No evidence of acute abnormality on limited assessment. Other neck: Subcentimeter thyroid nodules which do not require further imaging follow-up (Ref: J Am Coll Radiol. 2015 Feb;12(2): 143-50). Upper chest: Visualized lung apices are clear. Review of the MIP images confirms the above findings CTA HEAD FINDINGS Anterior circulation: No significant stenosis, proximal occlusion, aneurysm, or vascular malformation. Posterior circulation: No significant stenosis, proximal occlusion, aneurysm, or vascular malformation. Left fetal type PCA, anatomic variant. Venous sinuses: See below. Anatomic variants: See above. Review of the MIP images confirms the above findings CTV HEAD FINDINGS Please note that due to timing the dural venous sinuses are actually better visualized/assessed on the CTA portion of the exam. No evidence of dural venous sinus thrombosis. The superior sagittal, transverse, sigmoid, and straight sinuses are patent. Visualized deep cerebral veins are patent. 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 IMPRESSION: 1. No large vessel arterial occlusion or proximal hemodynamically significant stenosis. 2. No evidence of arteriovenous malformation or aneurysm. 3.  No evidence of dural venous sinus thrombosis. 4. Partially empty sella and nondominant and narrowed left transverse sinus, which may represent normal anatomic variation but can be seen with idiopathic intracranial hypertension in the correct clinical setting. Electronically Signed   By: Feliberto Harts M.D.   On: 05/22/2023 17:52   CT ANGIO HEAD NECK W WO CM  Result Date: 05/22/2023 CLINICAL DATA:  AVM/AVF, high-flow vascular malformation; Dural venous sinus thrombosis suspected. Headache. Vision changes. EXAM: CT ANGIOGRAPHY HEAD AND NECK WITH AND WITHOUT CONTRAST CT VENOGRAM HEAD TECHNIQUE:  Multidetector CT imaging of the head and neck was performed using the standard protocol during bolus administration of intravenous contrast. Multiplanar CT image reconstructions and MIPs were obtained to evaluate the vascular anatomy. Carotid stenosis measurements (when applicable) are obtained utilizing NASCET criteria, using the distal internal carotid diameter as the denominator. Venographic phase images of the brain were obtained following the administration of intravenous contrast. Multiplanar reformats and maximum intensity projections were generated. RADIATION DOSE REDUCTION: This exam was performed according to the departmental dose-optimization program which includes automated exposure control, adjustment of the mA and/or kV according to patient size and/or use of iterative reconstruction technique. CONTRAST:  75mL OMNIPAQUE IOHEXOL 350 MG/ML SOLN COMPARISON:  Same day head CT. FINDINGS: CTA NECK FINDINGS Aortic arch: Great vessel origins are patent without significant stenosis. Right carotid system: No evidence of dissection, stenosis (50% or greater), or occlusion. Left carotid system: No evidence of dissection, stenosis (50% or greater), or occlusion. Vertebral arteries: Codominant. No evidence of dissection, stenosis (50% or greater), or occlusion. Skeleton: No evidence of acute abnormality on limited assessment. Other neck: Subcentimeter thyroid nodules which do not require further imaging follow-up (Ref: J Am Coll Radiol. 2015 Feb;12(2): 143-50). Upper chest: Visualized lung apices are clear. Review of the MIP images confirms the above findings CTA HEAD FINDINGS Anterior circulation: No significant stenosis, proximal occlusion, aneurysm, or vascular malformation. Posterior circulation: No significant stenosis, proximal occlusion, aneurysm, or vascular malformation. Left fetal type PCA, anatomic variant. Venous sinuses: See below. Anatomic variants: See above. Review of the MIP images confirms the  above findings CTV HEAD FINDINGS Please note that due to timing the dural venous sinuses are actually better visualized/assessed on the CTA portion of the exam. No evidence of dural venous sinus thrombosis. The superior sagittal, transverse, sigmoid, and straight sinuses are patent. Visualized deep cerebral veins are patent. 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 IMPRESSION: 1. No large vessel arterial occlusion or proximal hemodynamically significant stenosis. 2. No evidence of arteriovenous malformation or aneurysm. 3. No evidence of dural venous sinus thrombosis. 4. Partially empty sella and nondominant and narrowed left transverse sinus, which may represent normal anatomic variation but can be seen with idiopathic intracranial hypertension in the correct clinical setting. Electronically Signed   By: Feliberto Harts M.D.   On: 05/22/2023 17:52   CT Head Wo Contrast  Result Date: 05/22/2023 CLINICAL DATA:  Neuro deficit, acute, stroke suspected.  Headache EXAM: CT HEAD WITHOUT CONTRAST TECHNIQUE: Contiguous axial images were obtained from the base of the skull through the vertex without intravenous contrast. RADIATION DOSE REDUCTION: This exam was performed according to the departmental dose-optimization program which includes automated exposure control, adjustment of the mA and/or kV according to patient size and/or use of iterative reconstruction technique. COMPARISON:  None Available. FINDINGS: Brain: No evidence of acute infarction, hemorrhage, hydrocephalus, extra-axial collection or mass lesion/mass effect. Vascular: No hyperdense vessel or unexpected calcification. Skull: Normal. Negative for fracture  or focal lesion. Sinuses/Orbits: No acute finding. Other: None. IMPRESSION: No acute intracranial findings. Electronically Signed   By: Duanne Guess D.O.   On: 05/22/2023 15:15   CT Orbits W Contrast  Result Date: 05/22/2023 CLINICAL DATA:  Headache, double  vision, photosensitivity. EXAM: CT ORBITS WITH CONTRAST TECHNIQUE: Multidetector CT images was performed according to the standard protocol following intravenous contrast administration. RADIATION DOSE REDUCTION: This exam was performed according to the departmental dose-optimization program which includes automated exposure control, adjustment of the mA and/or kV according to patient size and/or use of iterative reconstruction technique. CONTRAST:  75mL OMNIPAQUE IOHEXOL 300 MG/ML  SOLN COMPARISON:  None Available. FINDINGS: Orbits: Right: The globe is normal. The extraocular muscles are normal. The orbital fat is normal. The optic nerve is normal. The lacrimal gland is normal. Left: The globe is normal. The extraocular muscles are normal. The orbital fat is normal. The optic nerve is normal. The lacrimal gland is normal. Visible paranasal sinuses: There is mild mucosal thickening in the paranasal sinuses. Soft tissues: Unremarkable. Osseous: There is no acute osseous abnormality or suspicious osseous lesion. Limited intracranial: Assessed on the separately dictated CT head. IMPRESSION: Normal CT appearance of the globes and orbits with no acute finding. Electronically Signed   By: Lesia Hausen M.D.   On: 05/22/2023 15:15    Assessment/Plan Present on Admission:  Uveitis -continue atropine  and steroid drops -DDx : Infection, MS, SLE, sarcoid, autoimmune causes, HIV, syphilis inflammatory conditions -ESR, CRP, HIV, RPR, TSH ordered -CXR negative for sarcoidosis -MRI head and Orbits, LP ordered by Neuro -Neuro consult placed -ANA, RF ordered, however patient w/o related complaints. Expect low yield results -Resp panel negative  Ekin Pilar 05/22/2023, 8:02 PM

## 2023-05-22 NOTE — ED Provider Notes (Signed)
[place] EMERGENCY DEPARTMENT AT [facility] Provider Note   CSN: [id number] Arrival date & time: 05/22/23  1038     History  Chief Complaint  Patient presents with   Diplopia    [name] is a 37 y.o. female.  Is a 37 year old female presenting the emergency department for double vision.  Reports that she had headache developed Saturday evening, awoke Sunday with continued headache and blurred vision.  She was able to get into see her ophthalmologist who diagnosed her with uveitis place her on steroid drops and atropine.  Reevaluated by her ophthalmologist again today with reported improvement of uveitis, patient continued to have vertical diplopia.  Continuing to have slight headache.  No facial droop, no aphasia, no unilateral weakness, no numbness tingling changes in sensation.  She does report that she had sinusitis a week or 2 ago.  No other viral symptoms or infectious symptoms.        Home Medications Prior to Admission medications   Medication Sig Start Date End Date Taking? Authorizing Provider  atropine 1 % ophthalmic solution Place 2 drops into both eyes daily. 05/21/23  Yes [provider]  Cyanocobalamin (VITAMIN B-12 PO) Take 1 tablet by mouth daily with lunch.   Yes [provider]  Difluprednate 0.05 % EMUL Apply 1 drop to eye every other day. 05/21/23  Yes [provider]  Multiple Vitamin (MULTIVITAMIN WITH MINERALS) TABS tablet Take 1 tablet by mouth in the morning.   Yes [provider]  Multiple Vitamins-Minerals (ZINC PO) Take 1 tablet by mouth daily with lunch.   Yes [provider]      Allergies    Patient has no known allergies.    Review of Systems   Review of Systems  Physical Exam Updated Vital Signs BP 116/69   Pulse 80   Temp (!) 97.1 F (36.2 C) (Oral)   Resp 16   Ht 6' (1.829 m)   Wt 122.5 kg   LMP 07/05/2022 (Exact Date)   SpO2 99%   BMI 36.62 kg/m  Physical  Exam Vitals and nursing note reviewed.  Constitutional:      General: She is not in acute distress.    Appearance: She is not toxic-appearing.  HENT:     Head: Normocephalic and atraumatic.     Nose: Nose normal.     Mouth/Throat:     Mouth: Mucous membranes are moist.  Eyes:     Extraocular Movements: Extraocular movements intact.     Conjunctiva/sclera: Conjunctivae normal.     Pupils: Pupils are equal, round, and reactive to light.  Cardiovascular:     Rate and Rhythm: Normal rate and regular rhythm.  Pulmonary:     Effort: Pulmonary effort is normal.     Breath sounds: Normal breath sounds.  Abdominal:     General: Abdomen is flat.     Palpations: Abdomen is soft.  Musculoskeletal:     Cervical back: Normal range of motion.  Neurological:     General: No focal deficit present.     Mental Status: She is alert and oriented to person, place, and time.     Cranial Nerves: No cranial nerve deficit.     Sensory: No sensory deficit.     Motor: No weakness.     Coordination: Coordination normal.  Psychiatric:        Mood and Affect: Mood normal.        Behavior: Behavior normal.     ED  Results / Procedures / Treatments   Labs (all labs ordered are listed, but only abnormal results are displayed) Labs Reviewed  CBC  BASIC METABOLIC PANEL  PREGNANCY, URINE    EKG None  Radiology CT Head Wo Contrast  Result Date: 05/22/2023 CLINICAL DATA:  Neuro deficit, acute, stroke suspected.  Headache EXAM: CT HEAD WITHOUT CONTRAST TECHNIQUE: Contiguous axial images were obtained from the base of the skull through the vertex without intravenous contrast. RADIATION DOSE REDUCTION: This exam was performed according to the departmental dose-optimization program which includes automated exposure control, adjustment of the mA and/or kV according to patient size and/or use of iterative reconstruction technique. COMPARISON:  None Available. FINDINGS: Brain: No evidence of acute infarction,  hemorrhage, hydrocephalus, extra-axial collection or mass lesion/mass effect. Vascular: No hyperdense vessel or unexpected calcification. Skull: Normal. Negative for fracture or focal lesion. Sinuses/Orbits: No acute finding. Other: None. IMPRESSION: No acute intracranial findings. Electronically Signed   By: Duanne Guess D.O.   On: 05/22/2023 15:15   CT Orbits W Contrast  Result Date: 05/22/2023 CLINICAL DATA:  Headache, double vision, photosensitivity. EXAM: CT ORBITS WITH CONTRAST TECHNIQUE: Multidetector CT images was performed according to the standard protocol following intravenous contrast administration. RADIATION DOSE REDUCTION: This exam was performed according to the departmental dose-optimization program which includes automated exposure control, adjustment of the mA and/or kV according to patient size and/or use of iterative reconstruction technique. CONTRAST:  75mL OMNIPAQUE IOHEXOL 300 MG/ML  SOLN COMPARISON:  None Available. FINDINGS: Orbits: Right: The globe is normal. The extraocular muscles are normal. The orbital fat is normal. The optic nerve is normal. The lacrimal gland is normal. Left: The globe is normal. The extraocular muscles are normal. The orbital fat is normal. The optic nerve is normal. The lacrimal gland is normal. Visible paranasal sinuses: There is mild mucosal thickening in the paranasal sinuses. Soft tissues: Unremarkable. Osseous: There is no acute osseous abnormality or suspicious osseous lesion. Limited intracranial: Assessed on the separately dictated CT head. IMPRESSION: Normal CT appearance of the globes and orbits with no acute finding. Electronically Signed   By: Lesia Hausen M.D.   On: 05/22/2023 15:15    Procedures Procedures    Medications Ordered in ED Medications  sodium chloride 0.9 % bolus 1,000 mL (has no administration in time range)  iohexol (OMNIPAQUE) 300 MG/ML solution 100 mL (75 mLs Intravenous Contrast Given 05/22/23 1301)    ED Course/  Medical Decision Making/ A&P Clinical Course as of 05/22/23 1556  Tue May 22, 2023  1514 Case discussed with Maralyn Sago reports, patient's ophthalmologist [phone].  Who noted visual acuity and retina are all within normal limits and signs of uveitis improved.  Given patient's vertical diplopia was concern for possible intracranial pathology. [TY]  1523 CT Orbits W Contrast MPRESSION: Normal CT appearance of the globes and orbits with no acute finding.   [TY]  1523 CT Head Wo Contrast IMPRESSION: No acute intracranial findings.   [TY]  [id number] Spoke with Dr. Iver Nestle with neurology; recommending CTA and CTV head and transfer to [facility] was along for MRI brain and orbits with and without contrast. [TY]  1556 Care signed out to Dr. Silverio Lay.  [TY]    Clinical Course User Index [TY] Coral Spikes, DO                                 Medical Decision Making 37 year old female present emergency department  with diplopia in the setting of recently died of nose uveitis.  She is afebrile vital signs reassuring.  Physical exam with some vertical diplopia, but pain-free EOM's.  No localizing neurodeficits.  Will get basic labs and CT head.  See ED course for further and disposition.  Amount and/or Complexity of Data Reviewed External Data Reviewed:     Details: Paperwork with patient with reassuring visual acuity Labs: ordered. Decision-making details documented in ED Course. Radiology: ordered. Decision-making details documented in ED Course.  Risk Prescription drug management.          Final Clinical Impression(s) / ED Diagnoses Final diagnoses:  Diplopia    Rx / DC Orders ED Discharge Orders     None         Coral Spikes, DO 05/22/23 1556

## 2023-05-22 NOTE — Plan of Care (Addendum)
Discussed with Dr. Maple Hudson and Dr. Rubin Payor  These are curbside recommendations based upon the information readily available in the chart on brief review as well as history and examination information provided to me by requesting provider and do not replace a full detailed consult  History provided by Dr. Maple Hudson: "37 year old female presenting the emergency department for double vision.  Reports that she had headache developed Saturday evening, awoke Sunday with continued headache and blurred vision.  She was able to get into see her ophthalmologist who diagnosed her with uveitis place her on steroid drops and atropine.  Reevaluated by her ophthalmologist again today with reported improvement of uveitis, patient continued to have vertical diplopia.  Continuing to have slight headache.  No facial droop, no aphasia, no unilateral weakness, no numbness tingling changes in sensation.  She does report that she had sinusitis a week or 2 ago.  No other viral symptoms or infectious symptoms."  Workup to rule out dangerous etiologies of her symptoms has been requested  I recommend: MRA and MRV given she got contrast for CT orbits w/ contrast or CTA CTV if contrast load is not felt to be a contraindication MRI brain and MRI orbits with and without contrast Lumbar puncture for opening pressure, glucose, protein, cell counts in tubes 1 and 4, oligoclonal bands, IgG index If studies are reassuring and patient remains minimally symptomatic, close outpatient follow-up is appropriate  Brooke Dare MD-PhD [facility] [phone] Available 7 AM to 7 PM, outside these hours please contact Neurologist on call listed on AMION

## 2023-05-22 NOTE — ED Notes (Signed)
[name] with cl called for transport

## 2023-05-23 ENCOUNTER — Observation Stay (HOSPITAL_COMMUNITY): Payer: BC Managed Care – PPO

## 2023-05-23 DIAGNOSIS — H468 Other optic neuritis: Secondary | ICD-10-CM | POA: Diagnosis not present

## 2023-05-23 DIAGNOSIS — E669 Obesity, unspecified: Secondary | ICD-10-CM | POA: Diagnosis present

## 2023-05-23 DIAGNOSIS — Z6836 Body mass index (BMI) 36.0-36.9, adult: Secondary | ICD-10-CM | POA: Diagnosis not present

## 2023-05-23 DIAGNOSIS — H532 Diplopia: Secondary | ICD-10-CM | POA: Diagnosis not present

## 2023-05-23 DIAGNOSIS — J45909 Unspecified asthma, uncomplicated: Secondary | ICD-10-CM | POA: Diagnosis present

## 2023-05-23 DIAGNOSIS — Z79899 Other long term (current) drug therapy: Secondary | ICD-10-CM | POA: Diagnosis not present

## 2023-05-23 DIAGNOSIS — R519 Headache, unspecified: Secondary | ICD-10-CM

## 2023-05-23 DIAGNOSIS — H209 Unspecified iridocyclitis: Secondary | ICD-10-CM | POA: Diagnosis present

## 2023-05-23 DIAGNOSIS — H469 Unspecified optic neuritis: Secondary | ICD-10-CM

## 2023-05-23 DIAGNOSIS — Z98891 History of uterine scar from previous surgery: Secondary | ICD-10-CM | POA: Diagnosis not present

## 2023-05-23 DIAGNOSIS — H40052 Ocular hypertension, left eye: Secondary | ICD-10-CM | POA: Diagnosis present

## 2023-05-23 LAB — CSF CELL COUNT WITH DIFFERENTIAL
Eosinophils, CSF: 0 % (ref 0–1)
Lymphs, CSF: 15 % — ABNORMAL LOW (ref 40–80)
Monocyte-Macrophage-Spinal Fluid: 16 % (ref 15–45)
RBC Count, CSF: 3750 /mm3 — ABNORMAL HIGH
Segmented Neutrophils-CSF: 69 % — ABNORMAL HIGH (ref 0–6)
Tube #: 1
WBC, CSF: 20 /mm3 (ref 0–5)

## 2023-05-23 LAB — MENINGITIS/ENCEPHALITIS PANEL (CSF)

## 2023-05-23 LAB — CBC WITH DIFFERENTIAL/PLATELET
Abs Immature Granulocytes: 0.02 10*3/uL (ref 0.00–0.07)
Basophils Absolute: 0.1 10*3/uL (ref 0.0–0.1)
Basophils Relative: 1 %
Eosinophils Absolute: 0.1 10*3/uL (ref 0.0–0.5)
Eosinophils Relative: 1 %
HCT: 40.4 % (ref 36.0–46.0)
Hemoglobin: 13.3 g/dL (ref 12.0–15.0)
Immature Granulocytes: 0 %
Lymphocytes Relative: 17 %
Lymphs Abs: 1.3 10*3/uL (ref 0.7–4.0)
MCH: 27.6 pg (ref 26.0–34.0)
MCHC: 32.9 g/dL (ref 30.0–36.0)
MCV: 83.8 fL (ref 80.0–100.0)
Monocytes Absolute: 0.3 10*3/uL (ref 0.1–1.0)
Monocytes Relative: 4 %
Neutro Abs: 6.2 10*3/uL (ref 1.7–7.7)
Neutrophils Relative %: 77 %
Platelets: 363 10*3/uL (ref 150–400)
RBC: 4.82 MIL/uL (ref 3.87–5.11)
RDW: 13 % (ref 11.5–15.5)
WBC: 8 10*3/uL (ref 4.0–10.5)
nRBC: 0 % (ref 0.0–0.2)

## 2023-05-23 LAB — BASIC METABOLIC PANEL
Anion gap: 11 (ref 5–15)
BUN: 5 mg/dL — ABNORMAL LOW (ref 6–20)
CO2: 21 mmol/L — ABNORMAL LOW (ref 22–32)
Calcium: 9 mg/dL (ref 8.9–10.3)
Chloride: 105 mmol/L (ref 98–111)
Creatinine, Ser: 0.7 mg/dL (ref 0.44–1.00)
GFR, Estimated: >60 mL/min (ref >60)
Glucose, Bld: 97 mg/dL (ref 70–99)
Potassium: 3.7 mmol/L (ref 3.5–5.1)
Sodium: 137 mmol/L (ref 135–145)

## 2023-05-23 LAB — PROTEIN AND GLUCOSE, CSF
Glucose, CSF: 43 mg/dL (ref 40–70)
Total  Protein, CSF: 86 mg/dL — ABNORMAL HIGH (ref 15–45)

## 2023-05-23 LAB — TSH: TSH: 2.364 u[IU]/mL (ref 0.350–4.500)

## 2023-05-23 LAB — SEDIMENTATION RATE: Sed Rate: 11 mm/hr (ref 0–22)

## 2023-05-23 LAB — RPR: RPR Ser Ql: NONREACTIVE

## 2023-05-23 LAB — C-REACTIVE PROTEIN: CRP: 0.5 mg/dL (ref <1.0)

## 2023-05-23 MED ORDER — PREDNISOLONE ACETATE 1 % OP SUSP
1.0000 [drp] | OPHTHALMIC | Status: DC
  Administered 2023-05-24 – 2023-05-25 (×10): 1 [drp] via OPHTHALMIC
  Filled 2023-05-23: qty 5

## 2023-05-23 MED ORDER — LIDOCAINE HCL (PF) 1 % IJ SOLN
5.0000 mL | Freq: Once | INTRAMUSCULAR | Status: AC
  Administered 2023-05-23: 3 mL via INTRADERMAL
  Filled 2023-05-23: qty 5

## 2023-05-23 MED ORDER — SODIUM CHLORIDE 0.9 % IV SOLN
2.0000 g | INTRAVENOUS | Status: DC
  Administered 2023-05-23: 2 g via INTRAVENOUS
  Filled 2023-05-23: qty 20

## 2023-05-23 MED ORDER — ATROPINE SULFATE 1 % OP SOLN
2.0000 [drp] | Freq: Every day | OPHTHALMIC | Status: DC
  Filled 2023-05-23: qty 2

## 2023-05-23 MED ORDER — PREDNISOLONE ACETATE 1 % OP SUSP
1.0000 [drp] | Freq: Four times a day (QID) | OPHTHALMIC | Status: DC
  Administered 2023-05-23 (×3): 1 [drp] via OPHTHALMIC
  Filled 2023-05-23: qty 5

## 2023-05-23 NOTE — Progress Notes (Signed)
Progress Note    [name]   ZOX:[id number]  DOB: 1985/11/20  DOA: 05/22/2023     0 PCP: Assunta Found, MD  Initial CC: Headache, double vision, photosensitivity   Hospital Course: [name] is a 37 yo female with no significant PMH who presented to the ER with ongoing diplopia, headache, and onset of photosensitivity.  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  She endorsed some improvement of diplopia but still persistence.  With ongoing headaches and onset of photosensitivity, she presented to the ER after advice of ophthalmology. Multiple imaging studies obtained on admission including CT head, CT orbits, CT venogram and CT angio head/neck.  Then followed by MRI orbits and MRI brain. 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 She underwent evaluation by neurology on admission and was recommended for LP for further investigation.  Interval History:  No events overnight.  Still having ongoing diplopia, headache, and mild left eye photosensitivity. Undergoing LP today.  Assessment and Plan: * Headache - Multiple symptoms including persistent headache, diplopia, and new onset photosensitivity -LP attempted in ER and unsuccessful.  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 Will consult ophthalmology for ongoing eval and assistance while hospitalized  -  holding off on oral steroids until LP results reviewed by neurology as well    Old records reviewed in assessment of this patient  Antimicrobials:   DVT prophylaxis:  heparin injection 5,000 Units Start: 05/22/23 2200   Code Status:   Code Status: Full Code  Mobility Assessment (Last 72 Hours)     Mobility Assessment     Row Name 05/23/23 1200 05/23/23 0800 05/22/23 1926       Does patient have an order for bedrest or is patient medically unstable No - Continue assessment No - Continue assessment No - Continue assessment     What is the highest level of mobility based on the progressive mobility assessment? Level 6 (Walks independently in room and hall) - Balance while walking in room without assist - Complete Level 6 (Walks independently in room and hall) - Balance while walking in room without assist - Complete Level 6 (Walks independently in room and hall) - Balance while walking in room without assist - Complete              Barriers to discharge: none Disposition Plan:  Home Status is: Obs  Objective: Blood pressure 131/75, pulse 72, temperature 98.1 F (36.7 C), temperature source Oral, resp. rate 20, height 6' (1.829 m), weight 122.5 kg, last menstrual period 07/05/2022, SpO2 98%, unknown if currently breastfeeding.  Examination:  Physical Exam Constitutional:      Appearance: Normal appearance.  HENT:     Head: Normocephalic and atraumatic.     Mouth/Throat:     Mouth: Mucous membranes are moist.  Eyes:     Extraocular Movements: Extraocular movements intact.     Pupils: Pupils are unequal (Left pupil fixed/dilated ~33mm).  Left eye: Pupil is not reactive.     Visual Fields: Right eye visual fields normal.     Comments: Blurry vision in left eye but able to distinguish finger count; minimal pain with ocular movements. Peripheral vision intact bilaterally   Cardiovascular:     Rate and Rhythm: Normal rate and regular rhythm.  Pulmonary:     Effort:  Pulmonary effort is normal. No respiratory distress.     Breath sounds: Normal breath sounds. No wheezing.  Abdominal:     General: Bowel sounds are normal. There is no distension.     Palpations: Abdomen is soft.     Tenderness: There is no abdominal tenderness.  Musculoskeletal:        General: Normal range of motion.     Cervical back: Normal range of motion and neck supple.  Skin:    General: Skin is warm and dry.  Neurological:     General: No focal deficit present.     Mental Status: She is alert.     Sensory: No sensory deficit.     Motor: No weakness.  Psychiatric:        Mood and Affect: Mood normal.      Consultants:  Neurology Ophthalmology   Procedures:  05/23/23: LP with IR  Data Reviewed: Results for orders placed or performed during the hospital encounter of 05/22/23 (from the past 24 hour(s))  CSF culture w Gram Stain     Status: None (Preliminary result)   Collection Time: 05/22/23  7:25 PM   Specimen: CSF  Result Value Ref Range   Specimen Description CSF BACK    Special Requests NONE    Gram Stain      NO WBC SEEN NO ORGANISMS SEEN Performed at [facility], [address] [place], [place] [address]    Culture PENDING    Report Status PENDING   HIV Antibody (routine testing w rflx)     Status: None   Collection Time: 05/22/23  8:15 PM  Result Value Ref Range   HIV Screen 4th Generation wRfx Non Reactive Non Reactive  Sedimentation rate     Status: Abnormal   Collection Time: 05/22/23  8:15 PM  Result Value Ref Range   Sed Rate 27 (H) 0 - 22 mm/hr  C-reactive protein     Status: None   Collection Time: 05/22/23  8:15 PM  Result Value Ref Range   CRP <0.5 <1.0 mg/dL  RPR     Status: None   Collection Time: 05/22/23  8:15 PM  Result Value Ref Range   RPR Ser Ql NON REACTIVE NON REACTIVE  Basic metabolic panel     Status: Abnormal   Collection Time: 05/23/23  7:39 AM  Result Value Ref Range   Sodium 137 135 - 145 mmol/L   Potassium  3.7 3.5 - 5.1 mmol/L   Chloride 105 98 - 111 mmol/L   CO2 21 (L) 22 - 32 mmol/L   Glucose, Bld 97 70 - 99 mg/dL   BUN 5 (L) 6 - 20 mg/dL   Creatinine, Ser 6.04 0.44 - 1.00 mg/dL   Calcium 9.0 8.9 - 54.0 mg/dL   GFR, Estimated >98 >11 mL/min   Anion gap 11 5 - 15  CBC with Differential/Platelet     Status: None   Collection Time: 05/23/23  7:39 AM  Result Value Ref Range   WBC 8.0 4.0 - 10.5 K/uL   RBC 4.82 3.87 - 5.11 MIL/uL   Hemoglobin  13.3 12.0 - 15.0 g/dL   HCT 95.6 21.3 - 08.6 %   MCV 83.8 80.0 - 100.0 fL   MCH 27.6 26.0 - 34.0 pg   MCHC 32.9 30.0 - 36.0 g/dL   RDW 57.8 46.9 - 62.9 %   Platelets 363 150 - 400 K/uL   nRBC 0.0 0.0 - 0.2 %   Neutrophils Relative % 77 %   Neutro Abs 6.2 1.7 - 7.7 K/uL   Lymphocytes Relative 17 %   Lymphs Abs 1.3 0.7 - 4.0 K/uL   Monocytes Relative 4 %   Monocytes Absolute 0.3 0.1 - 1.0 K/uL   Eosinophils Relative 1 %   Eosinophils Absolute 0.1 0.0 - 0.5 K/uL   Basophils Relative 1 %   Basophils Absolute 0.1 0.0 - 0.1 K/uL   Immature Granulocytes 0 %   Abs Immature Granulocytes 0.02 0.00 - 0.07 K/uL  TSH     Status: None   Collection Time: 05/23/23  7:39 AM  Result Value Ref Range   TSH 2.364 0.350 - 4.500 uIU/mL  Sedimentation rate     Status: None   Collection Time: 05/23/23  7:39 AM  Result Value Ref Range   Sed Rate 11 0 - 22 mm/hr  C-reactive protein     Status: None   Collection Time: 05/23/23  7:39 AM  Result Value Ref Range   CRP 0.5 <1.0 mg/dL  Meningitis/Encephalitis Panel (CSF)     Status: None   Collection Time: 05/23/23  2:17 PM  Result Value Ref Range   Cryptococcus neoformans/gattii (CSF) NOT DETECTED NOT DETECTED   Cytomegalovirus (CSF) NOT DETECTED NOT DETECTED   Enterovirus (CSF) NOT DETECTED NOT DETECTED   Escherichia coli K1 (CSF) NOT DETECTED NOT DETECTED   Haemophilus influenzae (CSF) NOT DETECTED NOT DETECTED   Herpes simplex virus 1 (CSF) NOT DETECTED NOT DETECTED   Herpes simplex virus 2 (CSF) NOT  DETECTED NOT DETECTED   Human herpesvirus 6 (CSF) NOT DETECTED NOT DETECTED   Human parechovirus (CSF) NOT DETECTED NOT DETECTED   Listeria monocytogenes (CSF) NOT DETECTED NOT DETECTED   Neisseria meningitis (CSF) NOT DETECTED NOT DETECTED   Streptococcus agalactiae (CSF) NOT DETECTED NOT DETECTED   Streptococcus pneumoniae (CSF) NOT DETECTED NOT DETECTED   Varicella zoster virus (CSF) NOT DETECTED NOT DETECTED  CSF cell count with differential     Status: Abnormal   Collection Time: 05/23/23  2:17 PM  Result Value Ref Range   Tube # 1    Color, CSF PINK (A) COLORLESS   Appearance, CSF CLOUDY (A) CLEAR   Supernatant COLORLESS    RBC Count, CSF 3,750 (H) 0 /cu mm   WBC, CSF 20 (HH) 0 - 5 /cu mm   Segmented Neutrophils-CSF 69 (H) 0 - 6 %   Lymphs, CSF 15 (L) 40 - 80 %   Monocyte-Macrophage-Spinal Fluid 16 15 - 45 %   Eosinophils, CSF 0 0 - 1 %  Protein and glucose, CSF     Status: Abnormal   Collection Time: 05/23/23  2:17 PM  Result Value Ref Range   Glucose, CSF 43 40 - 70 mg/dL   Total  Protein, CSF 86 (H) 15 - 45 mg/dL    I have reviewed pertinent nursing notes, vitals, labs, and images as necessary. I have ordered labwork to follow up on as indicated.  I have reviewed the last notes from staff over past 24 hours. I have discussed patient's care plan and test results with nursing staff,  CM/SW, and other staff as appropriate.  Time spent: Greater than 50% of the 55 minute visit was spent in counseling/coordination of care for the patient as laid out in the A&P.   LOS: 0 days   Lewie Chamber, MD [facility] 05/23/2023, 6:06 PM

## 2023-05-23 NOTE — Progress Notes (Signed)
Patient has 2 eye drops prescribed, Atropine and Pred Forte.  She insists on using her own home drops prescribed by her MD.    Geradine Girt pharmacist who stated she will come up and speak with patient.  She mentioned patient will use our drops or hers after proper labeling.   Pharmacist said she will get back to me after conversation.

## 2023-05-23 NOTE — Assessment & Plan Note (Addendum)
-   Multiple symptoms including persistent headache, diplopia, and new onset photosensitivity -LP attempted in ER and unsuccessful.  IR consulted for fluoroscopy guided LP - infectious and AI high on differential; LP appears suggestive of viral etiology (may be cause of optic neuritis?) - Protein 86 (elevated), Glucose 43 (NML) - CSF colorless, with WBC 20 (69% neut, 15% lymphs) - ME/enceph panel negative; multiple other studies in process on discharge; will be contacted by ID if any tests become positive or significant

## 2023-05-23 NOTE — Assessment & Plan Note (Addendum)
-   recent eval outpatient with ophthalmology and diagnosed with uveitis; started on atropine and steroid drops - MRI noting "acute left optic nerve perineuritis". -Evaluated by ophthalmology inpatient, appreciate assistance; recommended for high-dose steroids -Atropine discontinued per ophthalmology.  Steroid drops recommended q3h until discharge then QID until followup with ophthalmology -Initiated on Solu-Medrol 250 mg QID until discharge; discussed with ID and neurology -Outpatient follow-up with ophthalmology and also referred to neuro-ophthalmology - discharged on prolonged prednisone taper at discharge

## 2023-05-23 NOTE — Plan of Care (Signed)

## 2023-05-23 NOTE — Consult Note (Signed)
Chief Complaint/Reason for Consultation: Eye pain, diplopia, MRI showing perioptic neuritis  HPI: 37 yo with minimal past medical or ocular history presents with eye pain, diplopia, photophobia, and blurry vision OS. 4 days ago her symptoms started with headache around the OS. Patient noted the headache was similar to prior headaches. The following day, 3 days ago she developed photophobia, slightly blurry vision OS, and binocular diplopia. She describes the diplopia as mostly vertical, with a small diagonal component. She notes the diplopia is accentuated when she looks down and when she looks to the left. At this point she contacted her optometrist Corky Mull, OD @ [facility] and was seen the next day. She was diagnosed with uveitis and placed on difluprednate and atropine gtts. She also started having pain with eye movements on this day (2 days ago). The patient noted her photophobia and slight eye pain seemed to improve with the eye drops as well as some of the blurry vision, though there was no significant change in the diplopia.  Patient denies any recent URI or infections in general, though she states she had a similar headache 3 weeks ago, though this was without any eye symptoms.     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   Current Outpatient Medications on File Prior to Encounter  Medication Sig Dispense Refill   atropine 1 % ophthalmic solution Place 2 drops into both eyes daily.     Cyanocobalamin (VITAMIN B-12 PO) Take 1 tablet by mouth daily  with lunch.     Difluprednate 0.05 % EMUL Apply 1 drop to eye every other day.     Multiple Vitamin (MULTIVITAMIN WITH MINERALS) TABS tablet Take 1 tablet by mouth in the morning.     Multiple Vitamins-Minerals (ZINC PO) Take 1 tablet by mouth daily with lunch.    No Known Allergies    EXAMINATION  VAcc (near): OD: 20/20   OS: 20/25-2  Color vision : 14/14 ishihara OU  Pupils:  OD: round, reactive, no APD, 3mm OS: round, sluggishly reactive, unable to check APD due to dilation (no clear APD noted OS when observing reaction OD/reverse testing), 6.45mm (pharmacologic dilation - Atropine last gtt yesterday per pt)  T(iCare): OD:  19  mm Hg OS:  24  mm Hg  CVF: full to finger counting OU EOM:  OD: full, no limitation or restriction OS: full adduction, -2 supraduction, -2 abduction, -3 infraduction  Primary Gaze: 10-15PD Left hypertropia, flick-5PD Esotropia, greater deviation in left gaze and down gaze. Less deviation in right gaze and up gaze  Anterior Segment Exam (indirect & 20D lens): Ext/Lids: normal OU, no significant lid edema OU, no proptosis OU Conj/Sclera: white and quiet OU Cornea: clear OU, no abrasion or infiltrate OU AC: OD Deep and Quiet OS deep without hypopyon or haze (unable to assess for cell/flare inpatient) Iris: Round and Flat OU, no synechiae OU Lens: Clear OU  Dilated OU with phenylephrine and tropicamide OU 2052 hours  Dilated Fundus Exam: Vitreous: Clear OU, no vitritis OU Disc: sharp and pink with 0.25 c/d  OU, no disc edema or hemorrhages OU Macula: flat and dry OU, no chorioretinal infiltrates OU Vessels: normal distribution OU, perfused OU, no vascular sheathing OU Periphery: flat and attached 360 without breaks or tears OU, no chorioretinal infiltrates OU   Imp/Plan:  Left Optic Perineuritis We discussed the typical inflammatory nature of optic perineuritis and treatment of systemic corticosteroids. I agree with higher dose steroids and  slow taper under observation with either neuro-ophthalmology or neurology Infectious and inflammatory/rheumatologic panels mostly pending Diplopia Vertical deviation that I suspect is related to perineuritis/orbital inflammation. 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 Patient has also clinically improved with topical steroids and would continue these with slow taper. 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 Pred Acetate 1% Q3h until discharge then QID OS at discharge to continue until follow-up with ophthalmology Given lack of iris synechiae and significant current inflammation, can DC atropine gtts Ocular Hypertension OS  slightly elevated IOP - healthy optic nerve, may be related to uveitis or topic steroid use, no treatment indicated at this point, but recommend monitoring outpatient after discharge   Dispo: Recommend follow-up with Ophthalmology outpatient to treat Iritis/Uveitis and monitor pressure. I am happy to see patient in 1 week or so or if patient is able to get in with Neuro-ophthalmology, they can follow this. This would be needed to taper steroid and monitor intraocular pressure.   Shon Millet, M.D. Ophthalmology [facility]

## 2023-05-23 NOTE — H&P (Incomplete)
PCP:   Assunta Found, MD   Chief Complaint:  Uveitis  HPI: [name] with no significant past medical history.  To the morning she woke up with her vision being off.  She has light sensitivity and double vision.  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  Exam is otherwise normal she was started on atropine and steroid drops.  Patient was rechecked today, her uveitis was with 90% better but her double vision remained.  He was sent [facility] for further w/u. Patient states approximately 3 weeks ago developed some congestion. The left side of her face was puffy. She took a Z pack and her symptoms resolved.  At  [facility] ER CTA head and neck, CT orbits and CT head were done. Neurology was consulted to r/o other etiologies. Patient transferred for MRI brain and MRI orbits with and without contrast and LP.   Review of Systems:  Per HPI  Past Medical History: Past Medical History:  Diagnosis Date  . Asthma    as a child; no longer needs an inhaler  . Headache 04/04/2019   migraines once a month with this pregnancy only  . PONV (postoperative nausea and vomiting)    vomited with last c section   Past Surgical History:  Procedure Laterality Date  . BILATERAL SALPINGECTOMY Bilateral 2020  . CESAREAN SECTION N/A 06/20/2014   Procedure: CESAREAN SECTION;  Surgeon: Mitchel Honour, DO;  Location: [facility] OR[facility];  Service: Obstetrics;  Laterality: N/A;  . CESAREAN SECTION N/A 07/26/2016   Procedure: CESAREAN SECTION;  Surgeon: Mitchel Honour, DO;  Location: [facility];  Service: Obstetrics;  Laterality: N/A;  Repeat edc 08/07/16 nkda request RNFA  . CESAREAN SECTION WITH BILATERAL TUBAL LIGATION Bilateral 04/15/2019   Procedure: CESAREAN SECTION WITH BILATERAL TUBAL LIGATION;  Surgeon: Mitchel Honour, DO;  Location: [facility] OR[facility];  Service: Obstetrics;  Laterality: Bilateral;  Repeat edc 05/04/19 NKDA Tracey RNFA  . LUMBAR LAMINECTOMY/DECOMPRESSION  MICRODISCECTOMY Left 07/06/2022   Procedure: Microlumbar decompression L5-S1 left;  Surgeon: Jene Every, MD;  Location: [facility] OR;  Service: Orthopedics;  Laterality: Left;  2 hrs 3 C-bed  . TONSILLECTOMY AND ADENOIDECTOMY    . WRIST GANGLION EXCISION     left wrist    Medications: Prior to Admission medications   Medication Sig Start Date End Date Taking? Authorizing Provider  atropine 1 % ophthalmic solution Place 2 drops into both eyes daily. 05/21/23  Yes [provider]  Cyanocobalamin (VITAMIN B-12 PO) Take 1 tablet by mouth daily with lunch.   Yes [provider]  Difluprednate 0.05 % EMUL Apply 1 drop to eye every other day. 05/21/23  Yes [provider]  Multiple Vitamin (MULTIVITAMIN WITH MINERALS) TABS tablet Take 1 tablet by mouth in the morning.   Yes [provider]  Multiple Vitamins-Minerals (ZINC PO) Take 1 tablet by mouth daily with lunch.   Yes [provider]    Allergies:  No Known Allergies  Social History:  reports that she has never smoked. She has never used smokeless tobacco. She reports that she does not drink alcohol and does not use drugs.  Family History: Family History  Problem Relation Age of Onset  . Cancer Mother        gallbladder  . Cancer Maternal Aunt        breast  . Thyroid disease Maternal Aunt   . Thyroid disease Paternal Aunt     Physical Exam: Vitals:  05/22/23 1710 05/22/23 1721 05/22/23 1748 05/22/23 1949  BP: (!) 156/104   136/75  Pulse: 72 80  69  Resp: 16 17  18   Temp:   98 F (36.7 C) (!) 97.5 F (36.4 C)  TempSrc:   Oral Oral  SpO2: 100% 100%  100%  Weight:      Height:        General:  A&Ox3, well developed and nourished, no acute distress Eyes: Pink conjunctiva, no scleral icterus ENT: Moist oral mucosa Lungs: clear to ascultation, no wheeze, no crackles, no use of accessory muscles Cardiovascular: regular rate and rhythm, no murmurs. No JVD Abdomen: soft, positive  BS, non-tender, non-distended, no organomegaly GU: not examined Neuro: CN II - XII grossly intact, sensation intact Musculoskeletal: strength 5/5 all extremities, no edema Skin: no rash, no subcutaneous crepitation, no decubitus Psych: appropriate patient   Labs on Admission:  Recent Labs    05/22/23 1159  NA 139  K 4.0  CL 106  CO2 27  GLUCOSE 88  BUN 7  CREATININE 0.68  CALCIUM 9.4    Recent Labs    05/22/23 1159  WBC 8.4  HGB 13.1  HCT 39.4  MCV 82.9  PLT 358    Radiological Exams on Admission: CT VENOGRAM HEAD  Result Date: 05/22/2023 CLINICAL DATA:  AVM/AVF, high-flow vascular malformation; Dural venous sinus thrombosis suspected. Headache. Vision changes. EXAM: CT ANGIOGRAPHY HEAD AND NECK WITH AND WITHOUT CONTRAST CT VENOGRAM HEAD TECHNIQUE: Multidetector CT imaging of the head and neck was performed using the standard protocol during bolus administration of intravenous contrast. Multiplanar CT image reconstructions and MIPs were obtained to evaluate the vascular anatomy. Carotid stenosis measurements (when applicable) are obtained utilizing NASCET criteria, using the distal internal carotid diameter as the denominator. Venographic phase images of the brain were obtained following the administration of intravenous contrast. Multiplanar reformats and maximum intensity projections were generated. RADIATION DOSE REDUCTION: This exam was performed according to the departmental dose-optimization program which includes automated exposure control, adjustment of the mA and/or kV according to patient size and/or use of iterative reconstruction technique. CONTRAST:  75mL OMNIPAQUE IOHEXOL 350 MG/ML SOLN COMPARISON:  Same day head CT. FINDINGS: CTA NECK FINDINGS Aortic arch: Great vessel origins are patent without significant stenosis. Right carotid system: No evidence of dissection, stenosis (50% or greater), or occlusion. Left carotid system: No evidence of dissection, stenosis (50%  or greater), or occlusion. Vertebral arteries: Codominant. No evidence of dissection, stenosis (50% or greater), or occlusion. Skeleton: No evidence of acute abnormality on limited assessment. Other neck: Subcentimeter thyroid nodules which do not require further imaging follow-up (Ref: J Am Coll Radiol. 2015 Feb;12(2): 143-50). Upper chest: Visualized lung apices are clear. Review of the MIP images confirms the above findings CTA HEAD FINDINGS Anterior circulation: No significant stenosis, proximal occlusion, aneurysm, or vascular malformation. Posterior circulation: No significant stenosis, proximal occlusion, aneurysm, or vascular malformation. Left fetal type PCA, anatomic variant. Venous sinuses: See below. Anatomic variants: See above. Review of the MIP images confirms the above findings CTV HEAD FINDINGS Please note that due to timing the dural venous sinuses are actually better visualized/assessed on the CTA portion of the exam. No evidence of dural venous sinus thrombosis. The superior sagittal, transverse, sigmoid, and straight sinuses are patent. Visualized deep cerebral veins are patent. 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 IMPRESSION: 1. No large vessel arterial occlusion or proximal hemodynamically significant stenosis. 2. No evidence of arteriovenous malformation or aneurysm. 3.  No evidence of dural venous sinus thrombosis. 4. Partially empty sella and nondominant and narrowed left transverse sinus, which may represent normal anatomic variation but can be seen with idiopathic intracranial hypertension in the correct clinical setting. Electronically Signed   By: Feliberto Harts M.D.   On: 05/22/2023 17:52   CT ANGIO HEAD NECK W WO CM  Result Date: 05/22/2023 CLINICAL DATA:  AVM/AVF, high-flow vascular malformation; Dural venous sinus thrombosis suspected. Headache. Vision changes. EXAM: CT ANGIOGRAPHY HEAD AND NECK WITH AND WITHOUT CONTRAST CT VENOGRAM  HEAD TECHNIQUE: Multidetector CT imaging of the head and neck was performed using the standard protocol during bolus administration of intravenous contrast. Multiplanar CT image reconstructions and MIPs were obtained to evaluate the vascular anatomy. Carotid stenosis measurements (when applicable) are obtained utilizing NASCET criteria, using the distal internal carotid diameter as the denominator. Venographic phase images of the brain were obtained following the administration of intravenous contrast. Multiplanar reformats and maximum intensity projections were generated. RADIATION DOSE REDUCTION: This exam was performed according to the departmental dose-optimization program which includes automated exposure control, adjustment of the mA and/or kV according to patient size and/or use of iterative reconstruction technique. CONTRAST:  75mL OMNIPAQUE IOHEXOL 350 MG/ML SOLN COMPARISON:  Same day head CT. FINDINGS: CTA NECK FINDINGS Aortic arch: Great vessel origins are patent without significant stenosis. Right carotid system: No evidence of dissection, stenosis (50% or greater), or occlusion. Left carotid system: No evidence of dissection, stenosis (50% or greater), or occlusion. Vertebral arteries: Codominant. No evidence of dissection, stenosis (50% or greater), or occlusion. Skeleton: No evidence of acute abnormality on limited assessment. Other neck: Subcentimeter thyroid nodules which do not require further imaging follow-up (Ref: J Am Coll Radiol. 2015 Feb;12(2): 143-50). Upper chest: Visualized lung apices are clear. Review of the MIP images confirms the above findings CTA HEAD FINDINGS Anterior circulation: No significant stenosis, proximal occlusion, aneurysm, or vascular malformation. Posterior circulation: No significant stenosis, proximal occlusion, aneurysm, or vascular malformation. Left fetal type PCA, anatomic variant. Venous sinuses: See below. Anatomic variants: See above. Review of the MIP images  confirms the above findings CTV HEAD FINDINGS Please note that due to timing the dural venous sinuses are actually better visualized/assessed on the CTA portion of the exam. No evidence of dural venous sinus thrombosis. The superior sagittal, transverse, sigmoid, and straight sinuses are patent. Visualized deep cerebral veins are patent. 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 IMPRESSION: 1. No large vessel arterial occlusion or proximal hemodynamically significant stenosis. 2. No evidence of arteriovenous malformation or aneurysm. 3. No evidence of dural venous sinus thrombosis. 4. Partially empty sella and nondominant and narrowed left transverse sinus, which may represent normal anatomic variation but can be seen with idiopathic intracranial hypertension in the correct clinical setting. Electronically Signed   By: Feliberto Harts M.D.   On: 05/22/2023 17:52   CT Head Wo Contrast  Result Date: 05/22/2023 CLINICAL DATA:  Neuro deficit, acute, stroke suspected.  Headache EXAM: CT HEAD WITHOUT CONTRAST TECHNIQUE: Contiguous axial images were obtained from the base of the skull through the vertex without intravenous contrast. RADIATION DOSE REDUCTION: This exam was performed according to the departmental dose-optimization program which includes automated exposure control, adjustment of the mA and/or kV according to patient size and/or use of iterative reconstruction technique. COMPARISON:  None Available. FINDINGS: Brain: No evidence of acute infarction, hemorrhage, hydrocephalus, extra-axial collection or mass lesion/mass effect. Vascular: No hyperdense vessel or unexpected calcification. Skull: Normal. Negative for fracture  or focal lesion. Sinuses/Orbits: No acute finding. Other: None. IMPRESSION: No acute intracranial findings. Electronically Signed   By: Duanne Guess D.O.   On: 05/22/2023 15:15   CT Orbits W Contrast  Result Date: 05/22/2023 CLINICAL DATA:   Headache, double vision, photosensitivity. EXAM: CT ORBITS WITH CONTRAST TECHNIQUE: Multidetector CT images was performed according to the standard protocol following intravenous contrast administration. RADIATION DOSE REDUCTION: This exam was performed according to the departmental dose-optimization program which includes automated exposure control, adjustment of the mA and/or kV according to patient size and/or use of iterative reconstruction technique. CONTRAST:  75mL OMNIPAQUE IOHEXOL 300 MG/ML  SOLN COMPARISON:  None Available. FINDINGS: Orbits: Right: The globe is normal. The extraocular muscles are normal. The orbital fat is normal. The optic nerve is normal. The lacrimal gland is normal. Left: The globe is normal. The extraocular muscles are normal. The orbital fat is normal. The optic nerve is normal. The lacrimal gland is normal. Visible paranasal sinuses: There is mild mucosal thickening in the paranasal sinuses. Soft tissues: Unremarkable. Osseous: There is no acute osseous abnormality or suspicious osseous lesion. Limited intracranial: Assessed on the separately dictated CT head. IMPRESSION: Normal CT appearance of the globes and orbits with no acute finding. Electronically Signed   By: Lesia Hausen M.D.   On: 05/22/2023 15:15    Assessment/Plan Present on Admission: . Uveitis -continue atropine  and steroid drops -DDx : Infection, MS, SLE, sarcoid, autoimmune causes, HIV, syphilis inflammatory conditions -[name] 05/22/2023, 8:02 PM

## 2023-05-23 NOTE — Plan of Care (Signed)
  Problem: Activity: Goal: Ability to tolerate increased activity will improve Outcome: Not Progressing   Problem: Safety: Goal: Ability to remain free from injury will improve Outcome: Not Progressing

## 2023-05-23 NOTE — Hospital Course (Addendum)
[name] is a 37 yo female with no significant PMH who presented to the ER with ongoing diplopia, headache, and onset of photosensitivity.  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  She endorsed some improvement of diplopia but still persistence.  With ongoing headaches and onset of photosensitivity, she presented to the ER after advice of ophthalmology. Multiple imaging studies obtained on admission including CT head, CT orbits, CT venogram and CT angio head/neck.  Then followed by MRI orbits and MRI brain. 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 She underwent evaluation by neurology on admission and was recommended for LP for further investigation. She was also evaluated by ophthalmology on 05/23/2023.  She was recommended for initiation of steroids in setting of optic neuritis. Plan is for steroid taper at discharge and she has been referred to neuro-ophthalmology as well.  Headache and pain with EOM in left eye improved by discharge. Still having some diplopia and blurry vision but slow improvement after initiation of steroids.

## 2023-05-23 NOTE — Consult Note (Addendum)
NEUROLOGY CONSULTATION NOTE   Date of service: May 23, 2023 Patient Name: [name] MRN:  [id number] DOB:  01-Oct-1985 Reason for consult: "Diplopia" Requesting Provider: Burnadette Pop, MD _ _ _   _ __   _ __ _ _  __ __   _ __   __ _  History of Present Illness  [name] is a 37 y.o. female with PMH significant for headaches, asthma, who reports left sided headache around left eye on Saturday evening which continued on Sunday with some blurred vision. She saw ophthalmology on Monday and diagnosed with uveitis and started on steroids eye drops and atropine. She was seen by optho for a follow up visit tuesday with improvement in uveitis but persistent double vision. Describes it as vertical diplopia.  Reports frequent headaches with migrainous features when she was pregnant. About 3 weeks ago, had headache on the left that went away on its own. At that time, was told she has sinusitis. No prior hx of neurologic diseases, no hx of autoimmune disorders.   ROS   Constitutional Denies weight loss, fever and chills.   HEENT + changes in vision with intact hearing.   Respiratory Denies SOB and cough.   CV Denies palpitations and CP   GI Denies abdominal pain, nausea, vomiting and diarrhea.   GU Denies dysuria and urinary frequency.   MSK Denies myalgia and joint pain.   Skin Denies rash and pruritus.   Neurological + headache but no syncope.   Psychiatric Denies recent changes in mood. Denies anxiety and depression.    Past History   Past Medical History:  Diagnosis Date   Asthma    as a child; no longer needs an inhaler   Headache 04/04/2019   migraines once a month with this pregnancy only   PONV (postoperative nausea and vomiting)    vomited with last c section   Past Surgical History:  Procedure Laterality Date   BILATERAL SALPINGECTOMY Bilateral 2020   CESAREAN SECTION N/A 06/20/2014   Procedure: CESAREAN SECTION;  Surgeon: Mitchel Honour, DO;  Location: [facility]  ORS;  Service: Obstetrics;  Laterality: N/A;   CESAREAN SECTION N/A 07/26/2016   Procedure: CESAREAN SECTION;  Surgeon: Mitchel Honour, DO;  Location: [facility];  Service: Obstetrics;  Laterality: N/A;  Repeat edc 08/07/16 nkda request RNFA   CESAREAN SECTION WITH BILATERAL TUBAL LIGATION Bilateral 04/15/2019   Procedure: CESAREAN SECTION WITH BILATERAL TUBAL LIGATION;  Surgeon: Mitchel Honour, DO;  Location: [facility] ORS;  Service: Obstetrics;  Laterality: Bilateral;  Repeat edc 05/04/19 NKDA Tracey RNFA   LUMBAR LAMINECTOMY/DECOMPRESSION MICRODISCECTOMY Left 07/06/2022   Procedure: Microlumbar decompression L5-S1 left;  Surgeon: Jene Every, MD;  Location: [facility] OR;  Service: Orthopedics;  Laterality: Left;  2 hrs 3 C-bed   TONSILLECTOMY AND ADENOIDECTOMY     WRIST GANGLION EXCISION     left wrist   Family History  Problem Relation Age of Onset   Cancer Mother        gallbladder   Cancer Maternal Aunt        breast   Thyroid disease Maternal Aunt    Thyroid disease Paternal Aunt    Social History   Socioeconomic History   Marital status: Married    Spouse name: Not on file   Number of children: Not on file   Years of education: Not on file   Highest education level: Not on file  Occupational History   Not on file  Tobacco Use  Smoking status: Never   Smokeless tobacco: Never  Vaping Use   Vaping status: Never Used  Substance and Sexual Activity   Alcohol use: No   Drug use: No   Sexual activity: Yes    Birth control/protection: None  Other Topics Concern   Not on file  Social History Narrative   Not on file   Social Determinants of Health   Financial Resource Strain: Low Risk  (04/03/2019)   Overall Financial Resource Strain (CARDIA)    Difficulty of Paying Living Expenses: Not hard at all  Food Insecurity: No Food Insecurity (05/22/2023)   Hunger Vital Sign    Worried About Running Out of Food in the Last Year: Never true    Ran Out of Food in the Last  Year: Never true  Transportation Needs: No Transportation Needs (05/22/2023)   PRAPARE - Administrator, Civil Service (Medical): No    Lack of Transportation (Non-Medical): No  Physical Activity: Not on file  Stress: Stress Concern Present (04/03/2019)   [employer] of Occupational Health - Occupational Stress Questionnaire    Feeling of Stress : Rather much  Social Connections: Not on file   No Known Allergies  Medications   Medications Prior to Admission  Medication Sig Dispense Refill Last Dose   atropine 1 % ophthalmic solution Place 2 drops into both eyes daily.   05/21/2023   Cyanocobalamin (VITAMIN B-12 PO) Take 1 tablet by mouth daily with lunch.   Past Week   Difluprednate 0.05 % EMUL Apply 1 drop to eye every other day.   05/22/2023   Multiple Vitamin (MULTIVITAMIN WITH MINERALS) TABS tablet Take 1 tablet by mouth in the morning.   Past Week   Multiple Vitamins-Minerals (ZINC PO) Take 1 tablet by mouth daily with lunch.   Past Week     Vitals   Vitals:   05/22/23 1721 05/22/23 1748 05/22/23 1949 05/22/23 2319  BP:   136/75 105/62  Pulse: 80  69 74  Resp: 17  18 (!) 22  Temp:  98 F (36.7 C) (!) 97.5 F (36.4 C) 97.9 F (36.6 C)  TempSrc:  Oral Oral Oral  SpO2: 100%  100% 99%  Weight:      Height:         Body mass index is 36.62 kg/m.  Physical Exam   General: Laying comfortably in bed; in no acute distress.  HENT: Normal oropharynx and mucosa. Normal external appearance of ears and nose.  Neck: Supple, no pain or tenderness  CV: No JVD. No peripheral edema.  Pulmonary: Symmetric Chest rise. Normal respiratory effort.  Abdomen: Soft to touch, non-tender.  Ext: No cyanosis, edema, or deformity  Skin: No rash. Normal palpation of skin.   Musculoskeletal: Normal digits and nails by inspection. No clubbing.   Neurologic Examination  Mental status/Cognition: Alert, oriented to self, place, month and year, good attention.  Speech/language:  Fluent, comprehension intact, object naming intact, repetition intact.  Cranial nerves:   CN II L pupil 6mm, round, dilated with poor reactivity to light likely due to dilation at optho appointment earlier.  no VF deficits but reports blurred vision from left eye.   CN III,IV,VI EOM intact, no gaze preference or deviation, no nystagmus. Subjective diplopia without any paresis/palsy.   CN V normal sensation in V1, V2, and V3 segments bilaterally    CN VII no asymmetry, no nasolabial fold flattening    CN VIII normal hearing to speech    CN  IX & X normal palatal elevation, no uvular deviation    CN XI 5/5 head turn and 5/5 shoulder shrug bilaterally    CN XII midline tongue protrusion    Motor:  Muscle bulk: normal, tone normal, pronator drift none tremor none Mvmt Root Nerve  Muscle Right Left Comments  SA C5/6 Ax Deltoid 5 5   EF C5/6 Mc Biceps 5 5   EE C6/7/8 Rad Triceps 5 5   WF C6/7 Med FCR     WE C7/8 PIN ECU     F Ab C8/T1 U ADM/FDI 5 5   HF L1/2/3 Fem Illopsoas 5 5   KE L2/3/4 Fem Quad 5 5   DF L4/5 D Peron Tib Ant 5 5   PF S1/2 Tibial Grc/Sol 5 5    Sensation:  Light touch Intact throughout   Pin prick    Temperature    Vibration   Proprioception    Coordination/Complex Motor:  - Finger to Nose intact BL - Heel to shin intact BL - Rapid alternating movement are normal - Gait: deferred for patient safety.  Labs   CBC:  Recent Labs  Lab 05/22/23 1159  WBC 8.4  HGB 13.1  HCT 39.4  MCV 82.9  PLT 358    Basic Metabolic Panel:  Lab Results  Component Value Date   NA 139 05/22/2023   K 4.0 05/22/2023   CO2 27 05/22/2023   GLUCOSE 88 05/22/2023   BUN 7 05/22/2023   CREATININE 0.68 05/22/2023   CALCIUM 9.4 05/22/2023   GFRNONAA >60 05/22/2023   GFRAA >60 04/04/2019   Lipid Panel: No results found for: "LDLCALC" HgbA1c: No results found for: "HGBA1C" Urine Drug Screen: No results found for: "LABOPIA", "COCAINSCRNUR", "LABBENZ", "AMPHETMU", "THCU",  "LABBARB"  Alcohol Level No results found for: "ETH" INR No results found for: "INR" APTT No results found for: "APTT"   CT Head without contrast(Personally reviewed): CTH was negative for a large hypodensity concerning for a large territory infarct or hyperdensity concerning for an ICH  CT angio Head and Neck with contrast(Personally reviewed): No aneurysm. No LVO.  CT Venogram head(Personally reviewed): No dural venous sinus thrombosis.  MRI Brain w + w/o C(Personally reviewed): 1. 6-7 mm focus of diffusion signal abnormality involving the deep white matter of the high posterior right frontal centrum semi ovale. Finding is nonspecific, and could reflect a small subacute small vessel infarct. An underlying vasculopathy could be considered given the concomitant findings of left optic nerve perineuritis. A demyelinating lesion would be the primary differential consideration. 2. A few additional subcentimeter foci of FLAIR hyperintensity involving the supratentorial cerebral white matter, nonspecific.  MRI Orbits(Personally reviewed): Acute left optic nerve perineuritis.  Impression   [name] is a 37 y.o. female with PMH significant for headaches, asthma who presents with left sided headache around left eye, light sensitivity and diplopia with L eye blurred vision. She was found to have uveitis and sent in for further workup to evaluate for any structural CNS lesion that could explain her vertical diplopia. Her MRI orbit demonstrates acute optic perineuritis.  Etiology unclear, could be primary(idiopathic) or secondary to underlying systemic autoimmune disease including Sjogrens, sarcoidosis, Igg4 related disease or MOGAD. This could also be potential infectious in nature thou no obvious signs of infection.  MRI also shows few scatter T2/FLAIR hyperintensities which I suspect are non specific and not really concerning for MS, may just be related to her history of migrainous  headaches.  Does have  an additional small high R parietal dwi positive lesion which could be a subacute/chronic stroke?  Recommendations  - agree with LP under fluoro with CSF cell count, differential, protein, glucose, Meningitis/encephalitis panel, autoimmune encephalitis panel, IgG index, Oligoclonal bands, ACE, VDRL CSF. - ANA IFA, DSDNA, Serum ACE, ENAL, ESR, CRP, RPR, Quantiferon gold plus, HIV, Lyme, Toxoplasma. - Recommend re-engaging optho in AM to see if they still think she had uveitis specially with the MRI demonstrating perineuritis. - if above is not concerning for an infectious process, would recommend starting steroids 60mg  daily with prolonged outpatient taper. PPI, Vit D with calcium while on steroids. - Would recommend follow up with neuroopthalmologist outpatient. ______________________________________________________________________   Thank you for the opportunity to take part in the care of this patient. If you have any further questions, please contact the neurology consultation attending.  Signed,  Erick Blinks [facility] _ _ _   _ __   _ __ _ _  __ __   _ __   __ _

## 2023-05-24 DIAGNOSIS — R519 Headache, unspecified: Secondary | ICD-10-CM | POA: Diagnosis not present

## 2023-05-24 DIAGNOSIS — H469 Unspecified optic neuritis: Secondary | ICD-10-CM | POA: Diagnosis not present

## 2023-05-24 LAB — ANTIEXTRACTABLE NUCLEAR AG
ENA SM Ab Ser-aCnc: <0.2 AI (ref 0.0–0.9)
Ribonucleic Protein: <0.2 AI (ref 0.0–0.9)

## 2023-05-24 LAB — CBC WITH DIFFERENTIAL/PLATELET
Abs Immature Granulocytes: 0.03 10*3/uL (ref 0.00–0.07)
Basophils Absolute: 0.1 10*3/uL (ref 0.0–0.1)
Basophils Relative: 1 %
Eosinophils Absolute: 0.1 10*3/uL (ref 0.0–0.5)
Eosinophils Relative: 2 %
HCT: 37.7 % (ref 36.0–46.0)
Hemoglobin: 12.8 g/dL (ref 12.0–15.0)
Immature Granulocytes: 0 %
Lymphocytes Relative: 24 %
Lymphs Abs: 1.8 10*3/uL (ref 0.7–4.0)
MCH: 28.2 pg (ref 26.0–34.0)
MCHC: 34 g/dL (ref 30.0–36.0)
MCV: 83 fL (ref 80.0–100.0)
Monocytes Absolute: 0.4 10*3/uL (ref 0.1–1.0)
Monocytes Relative: 5 %
Neutro Abs: 5.3 10*3/uL (ref 1.7–7.7)
Neutrophils Relative %: 68 %
Platelets: 341 10*3/uL (ref 150–400)
RBC: 4.54 MIL/uL (ref 3.87–5.11)
RDW: 13.1 % (ref 11.5–15.5)
WBC: 7.8 10*3/uL (ref 4.0–10.5)
nRBC: 0 % (ref 0.0–0.2)

## 2023-05-24 LAB — BASIC METABOLIC PANEL
Anion gap: 9 (ref 5–15)
BUN: 6 mg/dL (ref 6–20)
CO2: 23 mmol/L (ref 22–32)
Calcium: 8.9 mg/dL (ref 8.9–10.3)
Chloride: 106 mmol/L (ref 98–111)
Creatinine, Ser: 0.77 mg/dL (ref 0.44–1.00)
GFR, Estimated: >60 mL/min (ref >60)
Glucose, Bld: 100 mg/dL — ABNORMAL HIGH (ref 70–99)
Potassium: 3.6 mmol/L (ref 3.5–5.1)
Sodium: 138 mmol/L (ref 135–145)

## 2023-05-24 LAB — IGG 4: IgG, Subclass 4: 20 mg/dL (ref 2–96)

## 2023-05-24 LAB — ANGIOTENSIN CONVERTING ENZYME: Angiotensin-Converting Enzyme: 32 U/L (ref 14–82)

## 2023-05-24 LAB — MISC LABCORP TEST (SEND OUT)

## 2023-05-24 LAB — MAGNESIUM: Magnesium: 2 mg/dL (ref 1.7–2.4)

## 2023-05-24 LAB — TOXOPLASMA ANTIBODIES- IGG AND  IGM
Toxoplasma Antibody- IgM: <3 AU/mL (ref 0.0–7.9)
Toxoplasma IgG Ratio: <3 IU/mL (ref 0.0–7.1)

## 2023-05-24 LAB — IGG: IgG (Immunoglobin G), Serum: 1144 mg/dL (ref 586–1602)

## 2023-05-24 LAB — CYTOLOGY - NON PAP

## 2023-05-24 LAB — CSF CULTURE W GRAM STAIN: Gram Stain: NONE SEEN

## 2023-05-24 LAB — ANA: Anti Nuclear Antibody (ANA): NEGATIVE

## 2023-05-24 LAB — VDRL, CSF: VDRL Quant, CSF: NONREACTIVE

## 2023-05-24 LAB — RHEUMATOID FACTOR: Rheumatoid fact SerPl-aCnc: 11.5 IU/mL (ref <14.0)

## 2023-05-24 LAB — RPR: RPR Ser Ql: NONREACTIVE

## 2023-05-24 MED ORDER — SODIUM CHLORIDE 0.9 % IV SOLN
250.0000 mg | Freq: Four times a day (QID) | INTRAVENOUS | Status: DC
  Administered 2023-05-24 – 2023-05-25 (×5): 250 mg via INTRAVENOUS
  Filled 2023-05-24 (×8): qty 250

## 2023-05-24 MED ORDER — PANTOPRAZOLE SODIUM 40 MG PO TBEC
40.0000 mg | DELAYED_RELEASE_TABLET | Freq: Every day | ORAL | Status: DC
  Administered 2023-05-24 – 2023-05-25 (×2): 40 mg via ORAL
  Filled 2023-05-24 (×2): qty 1

## 2023-05-24 NOTE — Plan of Care (Signed)

## 2023-05-24 NOTE — Plan of Care (Signed)

## 2023-05-24 NOTE — Progress Notes (Signed)
Progress Note    [name]   WUJ:[id number]  DOB: Jun 01, 1986  DOA: 05/22/2023     1 PCP: Doris Found, MD  Initial CC: Headache, double vision, photosensitivity   Hospital Course: Ms. [name] is a 37 yo female with no significant PMH who presented to the ER with ongoing diplopia, headache, and onset of photosensitivity.  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  She endorsed some improvement of diplopia but still persistence.  With ongoing headaches and onset of photosensitivity, she presented to the ER after advice of ophthalmology. Multiple imaging studies obtained on admission including CT head, CT orbits, CT venogram and CT angio head/neck.  Then followed by MRI orbits and MRI brain. 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 She underwent evaluation by neurology on admission and was recommended for LP for further investigation. She was also evaluated by ophthalmology on 05/23/2023.  She was recommended for initiation of steroids in setting of optic neuritis.  Interval History:  No events overnight.  Improved pain with left ocular movements since yesterday. Still some diplopia but stable. No new neuro deficits or manifestations.   Assessment and Plan: * Headache - Multiple symptoms including persistent headache, diplopia, and new onset photosensitivity -LP attempted in ER and unsuccessful.  IR consulted for fluoroscopy guided LP - infectious and AI high on differential; LP appears consistent with viral meningitis  - Protein 86 (elevated), Glucose 43 (NML) - CSF colorless, with WBC 20 (69% neut, 15% lymphs) - ME/enceph panel negative; multiple other studies in process   Optic perineuritis - recent eval outpatient with  ophthalmology and diagnosed with uveitis; started on atropine and steroid drops - MRI noting "acute left optic nerve perineuritis". -Evaluated by ophthalmology inpatient, appreciate assistance; recommended for high-dose steroids -Atropine discontinued per ophthalmology.  Steroid drops recommended q3h until discharge then QID until followup with ophthalmology -Initiating Solu-Medrol 250 mg QID for now; discussed with ID and neurology -Outpatient follow-up with ophthalmology or neurology -Will also need steroid taper at discharge    Old records reviewed in assessment of this patient  Antimicrobials: Rocephin 9/25 >> 9/26  DVT prophylaxis:  heparin injection 5,000 Units Start: 05/22/23 2200   Code Status:   Code Status: Full Code  Mobility Assessment (Last 72 Hours)     Mobility Assessment     Row Name 05/24/23 0900 05/24/23 0748 05/23/23 2010 05/23/23 1200 05/23/23 0800   Does patient have an order for bedrest or is patient medically unstable No - Continue assessment No - Continue assessment No - Continue assessment No - Continue assessment No - Continue assessment   What is the highest level of mobility based on the progressive mobility assessment? Level 6 (Walks independently in room and hall) - Balance while walking in room without assist - Complete Level 6 (Walks independently in room and hall) - Balance while walking in room without assist - Complete Level 6 (Walks independently in room and hall) - Balance while walking in room without assist - Complete Level 6 (Walks independently in room and hall) - Balance while walking in room without assist - Complete Level 6 (Walks independently in room and hall) - Balance while walking in room without assist - Complete    Row Name 05/22/23 1926  Does patient have an order for bedrest or is patient medically unstable No - Continue assessment       What is the highest level of mobility based on the progressive mobility assessment?  Level 6 (Walks independently in room and hall) - Balance while walking in room without assist - Complete                Barriers to discharge: none Disposition Plan:  Home Status is: Obs  Objective: Blood pressure 124/81, pulse 63, temperature (!) 97.4 F (36.3 C), temperature source Oral, resp. rate 18, height 6' (1.829 m), weight 122.5 kg, last menstrual period 07/05/2022, SpO2 98%, unknown if currently breastfeeding.  Examination:  Physical Exam Constitutional:      Appearance: Normal appearance.  HENT:     Head: Normocephalic and atraumatic.     Mouth/Throat:     Mouth: Mucous membranes are moist.  Eyes:     Extraocular Movements: Extraocular movements intact.     Pupils: Pupils are unequal (Left pupil fixed/dilated ~23mm).     Left eye: Pupil is not reactive.     Visual Fields: Right eye visual fields normal.     Comments: Blurry vision in left eye but able to distinguish finger count; minimal pain with ocular movements. Peripheral vision intact bilaterally   Cardiovascular:     Rate and Rhythm: Normal rate and regular rhythm.  Pulmonary:     Effort: Pulmonary effort is normal. No respiratory distress.     Breath sounds: Normal breath sounds. No wheezing.  Abdominal:     General: Bowel sounds are normal. There is no distension.     Palpations: Abdomen is soft.     Tenderness: There is no abdominal tenderness.  Musculoskeletal:        General: Normal range of motion.     Cervical back: Normal range of motion and neck supple.  Skin:    General: Skin is warm and dry.  Neurological:     General: No focal deficit present.     Mental Status: She is alert.     Sensory: No sensory deficit.     Motor: No weakness.  Psychiatric:        Mood and Affect: Mood normal.      Consultants:  Neurology Ophthalmology  ID  Procedures:  05/23/23: LP with IR  Data Reviewed: Results for orders placed or performed during the hospital encounter of 05/22/23 (from the past 24  hour(s))  RPR     Status: None   Collection Time: 05/24/23  5:21 AM  Result Value Ref Range   RPR Ser Ql NON REACTIVE NON REACTIVE  Basic metabolic panel     Status: Abnormal   Collection Time: 05/24/23  5:21 AM  Result Value Ref Range   Sodium 138 135 - 145 mmol/L   Potassium 3.6 3.5 - 5.1 mmol/L   Chloride 106 98 - 111 mmol/L   CO2 23 22 - 32 mmol/L   Glucose, Bld 100 (H) 70 - 99 mg/dL   BUN 6 6 - 20 mg/dL   Creatinine, Ser 1.61 0.44 - 1.00 mg/dL   Calcium 8.9 8.9 - 09.6 mg/dL   GFR, Estimated >04 >54 mL/min   Anion gap 9 5 - 15  CBC with Differential/Platelet     Status: None   Collection Time: 05/24/23  5:21 AM  Result Value Ref Range   WBC 7.8 4.0 - 10.5 K/uL   RBC 4.54 3.87 - 5.11 MIL/uL   Hemoglobin 12.8  12.0 - 15.0 g/dL   HCT 09.8 11.9 - 14.7 %   MCV 83.0 80.0 - 100.0 fL   MCH 28.2 26.0 - 34.0 pg   MCHC 34.0 30.0 - 36.0 g/dL   RDW 82.9 56.2 - 13.0 %   Platelets 341 150 - 400 K/uL   nRBC 0.0 0.0 - 0.2 %   Neutrophils Relative % 68 %   Neutro Abs 5.3 1.7 - 7.7 K/uL   Lymphocytes Relative 24 %   Lymphs Abs 1.8 0.7 - 4.0 K/uL   Monocytes Relative 5 %   Monocytes Absolute 0.4 0.1 - 1.0 K/uL   Eosinophils Relative 2 %   Eosinophils Absolute 0.1 0.0 - 0.5 K/uL   Basophils Relative 1 %   Basophils Absolute 0.1 0.0 - 0.1 K/uL   Immature Granulocytes 0 %   Abs Immature Granulocytes 0.03 0.00 - 0.07 K/uL  Magnesium     Status: None   Collection Time: 05/24/23  5:21 AM  Result Value Ref Range   Magnesium 2.0 1.7 - 2.4 mg/dL    I have reviewed pertinent nursing notes, vitals, labs, and images as necessary. I have ordered labwork to follow up on as indicated.  I have reviewed the last notes from staff over past 24 hours. I have discussed patient's care plan and test results with nursing staff, CM/SW, and other staff as appropriate.  Time spent: Greater than 50% of the 55 minute visit was spent in counseling/coordination of care for the patient as laid out in the  A&P.   LOS: 1 day   Lewie Chamber, MD [facility] 05/24/2023, 2:23 PM

## 2023-05-24 NOTE — Progress Notes (Addendum)
Awaiting ID recs, will f/u [email or web address]. I placed an outpatient referral to Dr. Marcine Matar of [facility] for outpatient f/u.  Bing Neighbors, MD [facility] [phone]  If 7pm- 7am, please page neurology on call as listed in AMION.

## 2023-05-25 DIAGNOSIS — R519 Headache, unspecified: Secondary | ICD-10-CM | POA: Diagnosis not present

## 2023-05-25 DIAGNOSIS — H209 Unspecified iridocyclitis: Secondary | ICD-10-CM | POA: Diagnosis not present

## 2023-05-25 DIAGNOSIS — H469 Unspecified optic neuritis: Secondary | ICD-10-CM | POA: Diagnosis not present

## 2023-05-25 LAB — COMPREHENSIVE METABOLIC PANEL
ALT: 36 U/L (ref 0–44)
AST: 28 U/L (ref 15–41)
Albumin: 3.8 g/dL (ref 3.5–5.0)
Alkaline Phosphatase: 61 U/L (ref 38–126)
Anion gap: 10 (ref 5–15)
BUN: 10 mg/dL (ref 6–20)
CO2: 20 mmol/L — ABNORMAL LOW (ref 22–32)
Calcium: 9 mg/dL (ref 8.9–10.3)
Chloride: 109 mmol/L (ref 98–111)
Creatinine, Ser: 0.75 mg/dL (ref 0.44–1.00)
GFR, Estimated: >60 mL/min (ref >60)
Glucose, Bld: 192 mg/dL — ABNORMAL HIGH (ref 70–99)
Potassium: 3.1 mmol/L — ABNORMAL LOW (ref 3.5–5.1)
Sodium: 139 mmol/L (ref 135–145)
Total Bilirubin: 0.2 mg/dL — ABNORMAL LOW (ref 0.3–1.2)
Total Protein: 7.1 g/dL (ref 6.5–8.1)

## 2023-05-25 LAB — CBC WITH DIFFERENTIAL/PLATELET
Abs Immature Granulocytes: 0.08 10*3/uL — ABNORMAL HIGH (ref 0.00–0.07)
Basophils Absolute: 0 10*3/uL (ref 0.0–0.1)
Basophils Relative: 0 %
Eosinophils Absolute: 0 10*3/uL (ref 0.0–0.5)
Eosinophils Relative: 0 %
HCT: 39 % (ref 36.0–46.0)
Hemoglobin: 12.7 g/dL (ref 12.0–15.0)
Immature Granulocytes: 1 %
Lymphocytes Relative: 7 %
Lymphs Abs: 1.1 10*3/uL (ref 0.7–4.0)
MCH: 26.7 pg (ref 26.0–34.0)
MCHC: 32.6 g/dL (ref 30.0–36.0)
MCV: 82.1 fL (ref 80.0–100.0)
Monocytes Absolute: 0.4 10*3/uL (ref 0.1–1.0)
Monocytes Relative: 3 %
Neutro Abs: 15.3 10*3/uL — ABNORMAL HIGH (ref 1.7–7.7)
Neutrophils Relative %: 89 %
Platelets: 427 10*3/uL — ABNORMAL HIGH (ref 150–400)
RBC: 4.75 MIL/uL (ref 3.87–5.11)
RDW: 13.2 % (ref 11.5–15.5)
WBC: 17 10*3/uL — ABNORMAL HIGH (ref 4.0–10.5)
nRBC: 0 % (ref 0.0–0.2)

## 2023-05-25 LAB — BARTONELLA ANTIBODY PANEL
B Quintana IgM: NEGATIVE {titer}
B henselae IgG: NEGATIVE {titer}
B henselae IgM: NEGATIVE {titer}
B quintana IgG: NEGATIVE {titer}

## 2023-05-25 LAB — HIV-1 RNA QUANT-NO REFLEX-BLD
HIV 1 RNA Quant: <20 {copies}/mL
LOG10 HIV-1 RNA: UNDETERMINED {Log}

## 2023-05-25 LAB — LYME DISEASE DNA BY PCR(BORRELIA BURG): Lyme Disease(B.burgdorferi)PCR: NEGATIVE

## 2023-05-25 LAB — MAGNESIUM: Magnesium: 1.8 mg/dL (ref 1.7–2.4)

## 2023-05-25 LAB — MISC LABCORP TEST (SEND OUT): Labcorp test code: 505720

## 2023-05-25 MED ORDER — POTASSIUM CHLORIDE CRYS ER 20 MEQ PO TBCR
40.0000 meq | EXTENDED_RELEASE_TABLET | Freq: Once | ORAL | Status: AC
  Administered 2023-05-25: 40 meq via ORAL
  Filled 2023-05-25: qty 2

## 2023-05-25 MED ORDER — PREDNISONE 10 MG PO TABS: ORAL_TABLET | ORAL | 0 refills | Status: AC

## 2023-05-25 MED ORDER — PREDNISOLONE ACETATE 1 % OP SUSP: 1.0000 [drp] | Freq: Four times a day (QID) | OPHTHALMIC | 2 refills | Status: AC

## 2023-05-25 MED ORDER — PANTOPRAZOLE SODIUM 40 MG PO TBEC: 40.0000 mg | DELAYED_RELEASE_TABLET | Freq: Every day | ORAL | 5 refills | Status: AC

## 2023-05-25 NOTE — Progress Notes (Addendum)
Neurology Progress Note   S:// Patient sitting in the chair in no apparent distress.  She is eyepatch on her left eye.   Vision improving. Still with double vision but not as bad. No pain which is sig improvement.   ID consult still pending.   O:// Current vital signs: BP 130/76   Pulse 90   Temp 98.2 F (36.8 C) (Oral)   Resp 20   Ht 6' (1.829 m)   Wt 122.5 kg   LMP 07/05/2022 (Exact Date)   SpO2 96%   BMI 36.62 kg/m  Vital signs in last 24 hours: Temp:  [97.3 F (36.3 C)-98.3 F (36.8 C)] 98.2 F (36.8 C) (09/27 0737) Pulse Rate:  [90-101] 90 (09/27 0737) Resp:  [20] 20 (09/27 0432) BP: (117-146)/(67-80) 130/76 (09/27 0737) SpO2:  [94 %-97 %] 96 % (09/27 0737)  GENERAL: Awake, alert in NAD HEENT: - Normocephalic and atraumatic, dry mm   NEURO:  Mental Status: AA&Ox3   Language: speech is clear  Naming, repetition, fluency, and comprehension intact.  Cranial Nerves: Right PERRL. Left eye dilated 5mm.  Diplopia worse on left downward gaze and present in left gaze and left upward gaze and midpoint. She does not bury her sclera on left lateral gaze. Ptosis is 25% in left eye. No pain on palpation of her eyes or forehead. visual fields full.  no facial asymmetry,  facial sensation intact, hearing intact, tongue/uvula/soft palate midline, normal   sternocleidomastoid and trapezius muscle strength. No evidence of tongue atrophy or fibrillations  Motor:  5/5 in all 4 extremities Tone: is normal and bulk is normal Sensation- Intact to light touch bilaterally Coordination: FTN intact bilaterally, no ataxia in BLE. Gait- deferred  Medications  Current Facility-Administered Medications:    acetaminophen (TYLENOL) tablet 650 mg, 650 mg, Oral, Q6H PRN, 650 mg at 05/24/23 2134 **OR** acetaminophen (TYLENOL) suppository 650 mg, 650 mg, Rectal, Q6H PRN, Crosley, Debby, MD   heparin injection 5,000 Units, 5,000 Units, Subcutaneous, Q8H, Crosley, Debby, MD, 5,000 Units at 05/24/23  1335   methylPREDNISolone sodium succinate (SOLU-MEDROL) 250 mg in sodium chloride 0.9 % 50 mL IVPB, 250 mg, Intravenous, QID, Lewie Chamber, MD, Last Rate: 108 mL/hr at 05/24/23 2135, 250 mg at 05/24/23 2135   ondansetron (ZOFRAN) tablet 4 mg, 4 mg, Oral, Q6H PRN **OR** ondansetron (ZOFRAN) injection 4 mg, 4 mg, Intravenous, Q6H PRN, Crosley, Debby, MD   pantoprazole (PROTONIX) EC tablet 40 mg, 40 mg, Oral, Daily, Gevena Mart A, NP, 40 mg at 05/25/23 0911   prednisoLONE acetate (PRED FORTE) 1 % ophthalmic suspension 1 drop, 1 drop, Left Eye, Q3H while awake, Shon Millet, MD, 1 drop at 05/25/23 0911   senna-docusate (Senokot-S) tablet 1 tablet, 1 tablet, Oral, QHS PRN, Gery Pray, MD  Labs CBC    Component Value Date/Time   WBC 7.8 05/24/2023 0521   RBC 4.54 05/24/2023 0521   HGB 12.8 05/24/2023 0521   HCT 37.7 05/24/2023 0521   PLT 341 05/24/2023 0521   MCV 83.0 05/24/2023 0521   MCH 28.2 05/24/2023 0521   MCHC 34.0 05/24/2023 0521   RDW 13.1 05/24/2023 0521   LYMPHSABS 1.8 05/24/2023 0521   MONOABS 0.4 05/24/2023 0521   EOSABS 0.1 05/24/2023 0521   BASOSABS 0.1 05/24/2023 0521    CMP     Component Value Date/Time   NA 138 05/24/2023 0521   K 3.6 05/24/2023 0521   CL 106 05/24/2023 0521   CO2 23 05/24/2023 0521   GLUCOSE  100 (H) 05/24/2023 0521   BUN 6 05/24/2023 0521   CREATININE 0.77 05/24/2023 0521   CALCIUM 8.9 05/24/2023 0521   PROT 7.1 04/04/2019 1348   ALBUMIN 2.9 (L) 04/04/2019 1348   AST 18 04/04/2019 1348   ALT 24 04/04/2019 1348   ALKPHOS 197 (H) 04/04/2019 1348   BILITOT 0.7 04/04/2019 1348   GFRNONAA >60 05/24/2023 0521   GFRAA >60 04/04/2019 1348    Lipid Panel  No results found for: "CHOL", "TRIG", "HDL", "CHOLHDL", "VLDL", "LDLCALC", "LDLDIRECT"  No results found for: "HGBA1C"   Imaging I have reviewed images in epic and the results pertinent to this consultation are:   CT-scan of the brain-no acute process  CTA head and neck no  LVO  CT venogram no acute process  CT orbits no acute process  MRI orbits acute left optic nerve perineuritis  MRI examination of the brain-  6-7 mm focus of diffusion signal abnormality involving the deep white matter of the high posterior right frontal centrum semi ovale  Labs M/E negative CSF WBC 20 (69% neutrophils, 15% lymphs) CSF protein 86, glucose 43 antiMOG, autoimmune encephalitis panel, IgG index, Oligoclonal bands, ACE, VDRL CSF all pending  RPR nonreactive ANA IFA, DSDNA, Serum ACE, ENAL, ESR, CRP, RPR, Quantiferon gold plus, HIV, Lyme, Toxoplasma, pending  Assessment:  [name] is a 37 y.o. female with PMH significant for headaches, asthma who presents with left sided headache around left eye, light sensitivity and diplopia with L eye blurred vision. She was found to have uveitis and sent in for further workup to evaluate for any structural CNS lesion that could explain her vertical diplopia. Her MRI orbit demonstrates acute optic perineuritis.   Impression: Left optic nerve perineuritis  Recommendations: Can switch to high dose oral steroids 80mg /day with very slow taper over an extended period of time.  80 mg/day then reduce by 5 mg weekly.  Discussed with Pharmacy. Can f/u with optho and neuro optho for total treatment time.   Appreciate ID recs.   Discussed with DR. Girguis.   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     Neurology will sign off.

## 2023-05-25 NOTE — Discharge Summary (Signed)
Physician Discharge Summary   [name] WNU:[id number] DOB: Apr 26, 1986 DOA: 05/22/2023  PCP: Assunta Found, MD  Admit date: 05/22/2023 Discharge date: 05/25/2023  Admitted From: Home Disposition:  Home Discharging physician: Lewie Chamber, MD Barriers to discharge: none  Recommendations at discharge: Follow up with ophthalmology Referral placed for neuro-ophthalmology Prednisone course may be modified per follow-up with ophthalmology if necessary Repeat IOP at followup    Discharge Condition: stable CODE STATUS: Full Diet recommendation:  Diet Orders (From admission, onward)     Start     Ordered   05/25/23 0000  Diet general        05/25/23 1316   05/22/23 2000  Diet regular Room service appropriate? Yes; Fluid consistency: Thin  Diet effective now       Question Answer Comment  Room service appropriate? Yes   Fluid consistency: Thin      05/22/23 2000            Hospital Course: Ms. Booras is a 37 yo female with no significant PMH who presented to the ER with ongoing diplopia, headache, and onset of photosensitivity.  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  She endorsed some improvement of diplopia but still persistence.  With ongoing headaches and onset of photosensitivity, she presented to the ER after advice of ophthalmology. Multiple imaging studies obtained on admission including CT head, CT orbits, CT venogram and CT angio head/neck.  Then followed by MRI orbits and MRI brain. 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 She underwent evaluation by neurology on admission and was recommended for LP for further investigation. She was also evaluated by ophthalmology on 05/23/2023.  She was recommended for  initiation of steroids in setting of optic neuritis. Plan is for steroid taper at discharge and she has been referred to neuro-ophthalmology as well.  Headache and pain with EOM in left eye improved by discharge. Still having some diplopia and blurry vision but slow improvement after initiation of steroids.   Assessment and Plan: * Headache-resolved as of 05/25/2023 - Multiple symptoms including persistent headache, diplopia, and new onset photosensitivity -LP attempted in ER and unsuccessful.  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 -Evaluated by ophthalmology inpatient, appreciate assistance; recommended for high-dose steroids -Atropine discontinued per ophthalmology.  Steroid drops recommended q3h until discharge then QID until followup with ophthalmology -Initiated on Solu-Medrol 250 mg QID until discharge; discussed with ID and neurology -Outpatient follow-up with ophthalmology and also referred to neuro-ophthalmology - discharged on prolonged prednisone taper at discharge     The patient's acute and chronic medical conditions were treated accordingly. On day of discharge, patient was felt deemed stable for discharge. Patient/family member advised to call PCP or come back to ER if needed.   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    Resolved  Hospital Problems   Diagnosis Date Noted Date Resolved   Headache 05/23/2023 05/25/2023    Priority: 1.     Discharge Instructions     Ambulatory referral to Ophthalmology   Complete by: As directed    Diet general   Complete by: As directed    Increase activity slowly   Complete by: As directed       Allergies as of 05/25/2023   No Known Allergies      Medication List     STOP taking these medications    atropine 1 % ophthalmic solution   Difluprednate 0.05 % Emul       TAKE these medications    multivitamin with minerals Tabs tablet Take 1 tablet by mouth in the morning.   pantoprazole 40 MG tablet Commonly known as: PROTONIX Take 1 tablet (40 mg total) by mouth daily. Start taking on: May 26, 2023   prednisoLONE acetate 1 % ophthalmic suspension Commonly known as: PRED FORTE Place 1 drop into the left eye 4 (four) times daily.   predniSONE 10 MG tablet Commonly known as: DELTASONE Take 6 tablets daily for 1 week, then 5 tablets daily for 1 week, then 4 tablets daily for 1 week, then 3 tablets daily for 1 week, then 2 tablets daily for 1 week, then 1 tablet daily for 1 week   VITAMIN B-12 PO Take 1 tablet by mouth daily with lunch.   ZINC PO Take 1 tablet by mouth daily with lunch.        Follow-up Information     Shon Millet, MD. Schedule an appointment as soon as possible for a visit in 1 week(s).   Specialty: Ophthalmology Contact information: [address] [place] [address] [phone]                No Known Allergies  Consultations: ID Neurology Ophtho  IR  Procedures: 05/23/2023: LP with IR  Discharge Exam: BP 130/76   Pulse 90   Temp 98.2 F (36.8 C) (Oral)   Resp 20   Ht 6' (1.829 m)   Wt 122.5 kg   LMP 07/05/2022 (Exact Date)   SpO2 96%   BMI 36.62 kg/m  Physical Exam Constitutional:      Appearance: Normal appearance.  HENT:     Head: Normocephalic and atraumatic.     Mouth/Throat:      Mouth: Mucous membranes are moist.  Eyes:     Extraocular Movements: Extraocular movements intact.     Pupils: Pupils are unequal (Left pupil dilated ~34mm).     Left eye: Pupil is reactive (but still dilated).     Visual Fields: Right eye visual fields normal.     Comments: Blurry vision in left eye but able to distinguish finger count; no further pain with ocular movements. Peripheral vision intact bilaterally   Cardiovascular:     Rate and Rhythm: Normal rate and regular rhythm.  Pulmonary:     Effort: Pulmonary effort is normal. No respiratory distress.     Breath sounds: Normal breath sounds. No wheezing.  Abdominal:     General: Bowel sounds are normal. There is no distension.     Palpations: Abdomen is soft.     Tenderness: There is no abdominal tenderness.  Musculoskeletal:        General: Normal range of motion.  Cervical back: Normal range of motion and neck supple.  Skin:    General: Skin is warm and dry.  Neurological:     General: No focal deficit present.     Mental Status: She is alert.     Sensory: No sensory deficit.     Motor: No weakness.  Psychiatric:        Mood and Affect: Mood normal.      The results of significant diagnostics from this hospitalization (including imaging, microbiology, ancillary and laboratory) are listed below for reference.   Microbiology: Recent Results (from the past 240 hour(s))  CSF culture w Gram Stain     Status: None (Preliminary result)   Collection Time: 05/22/23  7:25 PM   Specimen: CSF  Result Value Ref Range Status   Specimen Description CSF BACK  Final   Special Requests NONE  Final   Gram Stain NO WBC SEEN NO ORGANISMS SEEN   Final   Culture   Final    NO GROWTH < 24 HOURS Performed at [facility], [address], [place], [place] [address]    Report Status PENDING  Incomplete     Labs: BNP (last 3 results) No results for input(s): "BNP" in the last 8760 hours. Basic Metabolic Panel: Recent  Labs  Lab 05/22/23 1159 05/23/23 0739 05/24/23 0521 05/25/23 1007  NA 139 137 138 139  K 4.0 3.7 3.6 3.1*  CL 106 105 106 109  CO2 27 21* 23 20*  GLUCOSE 88 97 100* 192*  BUN 7 5* 6 10  CREATININE 0.68 0.70 0.77 0.75  CALCIUM 9.4 9.0 8.9 9.0  MG  --   --  2.0 1.8   Liver Function Tests: Recent Labs  Lab 05/25/23 1007  AST 28  ALT 36  ALKPHOS 61  BILITOT 0.2*  PROT 7.1  ALBUMIN 3.8   No results for input(s): "LIPASE", "AMYLASE" in the last 168 hours. No results for input(s): "AMMONIA" in the last 168 hours. CBC: Recent Labs  Lab 05/22/23 1159 05/23/23 0739 05/24/23 0521 05/25/23 1007  WBC 8.4 8.0 7.8 17.0*  NEUTROABS  --  6.2 5.3 15.3*  HGB 13.1 13.3 12.8 12.7  HCT 39.4 40.4 37.7 39.0  MCV 82.9 83.8 83.0 82.1  PLT 358 363 341 427*   Cardiac Enzymes: No results for input(s): "CKTOTAL", "CKMB", "CKMBINDEX", "TROPONINI" in the last 168 hours. BNP: Invalid input(s): "POCBNP" CBG: No results for input(s): "GLUCAP" in the last 168 hours. D-Dimer No results for input(s): "DDIMER" in the last 72 hours. Hgb A1c No results for input(s): "HGBA1C" in the last 72 hours. Lipid Profile No results for input(s): "CHOL", "HDL", "LDLCALC", "TRIG", "CHOLHDL", "LDLDIRECT" in the last 72 hours. Thyroid function studies Recent Labs    05/23/23 0739  TSH 2.364   Anemia work up No results for input(s): "VITAMINB12", "FOLATE", "FERRITIN", "TIBC", "IRON", "RETICCTPCT" in the last 72 hours. Urinalysis    Component Value Date/Time   COLORURINE YELLOW 04/04/2019 1235   APPEARANCEUR CLEAR 04/04/2019 1235   LABSPEC 1.008 04/04/2019 1235   PHURINE 7.0 04/04/2019 1235   GLUCOSEU NEGATIVE 04/04/2019 1235   HGBUR NEGATIVE 04/04/2019 1235   BILIRUBINUR NEGATIVE 04/04/2019 1235   KETONESUR NEGATIVE 04/04/2019 1235   PROTEINUR NEGATIVE 04/04/2019 1235   UROBILINOGEN 0.2 06/06/2014 2335   NITRITE NEGATIVE 04/04/2019 1235   LEUKOCYTESUR NEGATIVE 04/04/2019 1235   Sepsis  Labs Recent Labs  Lab 05/22/23 1159 05/23/23 0739 05/24/23 0521 05/25/23 1007  WBC 8.4 8.0 7.8  17.0*   Microbiology Recent Results (from the past 240 hour(s))  CSF culture w Gram Stain     Status: None (Preliminary result)   Collection Time: 05/22/23  7:25 PM   Specimen: CSF  Result Value Ref Range Status   Specimen Description CSF BACK  Final   Special Requests NONE  Final   Gram Stain NO WBC SEEN NO ORGANISMS SEEN   Final   Culture   Final    NO GROWTH < 24 HOURS Performed at [facility], [address] [place], [place] [address]    Report Status PENDING  Incomplete    Procedures/Studies: DG FL GUIDED LUMBAR PUNCTURE  Result Date: 05/23/2023 CLINICAL DATA:  37 year old female with headache and diplopia. Request for lumbar puncture EXAM: LUMBAR PUNCTURE UNDER FLUOROSCOPY PROCEDURE: An appropriate skin entry site was determined fluoroscopically. Operator donned sterile gloves and mask. Skin site was marked, then prepped with Betadine, draped in usual sterile fashion, and infiltrated locally with 1% lidocaine. A 20 gauge spinal needle advanced into the thecal sac at L4-L5 from a right interlaminar approach. Clear colorless CSF spontaneously returned, with opening pressure of 21 cm water. 16 ml CSF were collected and divided among 4 sterile vials for the requested laboratory studies. The needle was then removed. The patient tolerated the procedure well and there were no complications. FLUOROSCOPY: Radiation Exposure Index (as provided by the fluoroscopic device): 3.2 mGy Kerma IMPRESSION: Technically successful lumbar puncture under fluoroscopy. This exam was performed by Loyce Dys, PA, and was supervised and interpreted by Malachy Moan, MD. Electronically Signed   By: Malachy Moan M.D.   On: 05/23/2023 17:24   MR ORBITS W WO CONTRAST  Result Date: 05/23/2023 CLINICAL DATA:  Initial evaluation for acute diplopia.  Headache. EXAM: MRI HEAD AND ORBITS WITHOUT AND  WITH CONTRAST TECHNIQUE: Multiplanar, multiecho pulse sequences of the brain and surrounding structures were obtained without and with intravenous contrast. Multiplanar, multiecho pulse sequences of the orbits and surrounding structures were obtained including fat saturation techniques, before and after intravenous contrast administration. CONTRAST:  10mL GADAVIST GADOBUTROL 1 MMOL/ML IV SOLN COMPARISON:  Comparison made with prior CTs from earlier the same day. FINDINGS: MRI HEAD FINDINGS Brain: Cerebral volume within normal limits. 6-7 mm focus of diffusion signal abnormality seen involving the deep white matter of the high posterior right frontal centrum semi ovale (series 2, image 35). Associated T2/FLAIR signal intensity without discernible signal loss on ADC map. Finding is nonspecific, and could reflect a small subacute small vessel infarct. A focus of demyelination could also be considered, although no associated enhancement is appreciated. No associated blood products. No other evidence for acute or subacute ischemia. A few additional subcentimeter foci of FLAIR hyperintensity noted involving the supratentorial cerebral white matter, most prominent of which measures 5 mm at the right periatrial region (series 6, image 19), nonspecific. No mass lesion, midline shift or mass effect. No hydrocephalus or extra-axial fluid collection. Pituitary gland felt to be within normal limits. Suprasellar region normal. No abnormal enhancement. Vascular: Major intracranial vascular flow voids are maintained. Skull and upper cervical spine: Craniocervical junction within normal limits. Bone marrow signal intensity normal. No scalp soft tissue abnormality. Other: Trace right mastoid effusion noted, of doubtful significance. MRI ORBITS FINDINGS Orbits: Globes are symmetric in size with normal appearance and morphology. There is enhancement and irregularity about the left optic nerve sheath extending towards the orbital apex,  consistent with acute left optic Peri neuritis (series 15, image  10). Hazy stranding within the adjacent intraconal fat. Relative sparing of the optic nerve itself, although this may be partially involved as well. Extra-ocular muscles symmetric and within normal limits. Lacrimal glands normal. No other abnormality about the orbital apices or cavernous sinus. Visualized sinuses: Mild scattered mucosal thickening present about the ethmoidal air cells and maxillary sinuses. Paranasal sinuses are otherwise clear. Soft tissues: Periorbital soft tissues demonstrate no acute finding. IMPRESSION: MRI ORBITS IMPRESSION: 1. 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 2. Otherwise normal MRI of the orbits. MRI HEAD IMPRESSION: 1. 6-7 mm focus of diffusion signal abnormality involving the deep white matter of the high posterior right frontal centrum semi ovale. Finding is nonspecific, and could reflect a small subacute small vessel infarct. An underlying vasculopathy could be considered given the concomitant findings of left optic nerve perineuritis. A demyelinating lesion would be the primary differential consideration. 2. A few additional subcentimeter foci of FLAIR hyperintensity involving the supratentorial cerebral white matter, nonspecific. Electronically Signed   By: Rise Mu M.D.   On: 05/23/2023 00:24   MR BRAIN W WO CONTRAST  Result Date: 05/23/2023 CLINICAL DATA:  Initial evaluation for acute diplopia.  Headache. EXAM: MRI HEAD AND ORBITS WITHOUT AND WITH CONTRAST TECHNIQUE: Multiplanar, multiecho pulse sequences of the brain and surrounding structures were obtained without and with intravenous contrast. Multiplanar, multiecho pulse sequences of the orbits and surrounding structures were obtained including fat saturation techniques, before and after intravenous contrast administration. CONTRAST:  10mL GADAVIST GADOBUTROL 1 MMOL/ML IV SOLN COMPARISON:  Comparison made with prior CTs  from earlier the same day. FINDINGS: MRI HEAD FINDINGS Brain: Cerebral volume within normal limits. 6-7 mm focus of diffusion signal abnormality seen involving the deep white matter of the high posterior right frontal centrum semi ovale (series 2, image 35). Associated T2/FLAIR signal intensity without discernible signal loss on ADC map. Finding is nonspecific, and could reflect a small subacute small vessel infarct. A focus of demyelination could also be considered, although no associated enhancement is appreciated. No associated blood products. No other evidence for acute or subacute ischemia. A few additional subcentimeter foci of FLAIR hyperintensity noted involving the supratentorial cerebral white matter, most prominent of which measures 5 mm at the right periatrial region (series 6, image 19), nonspecific. No mass lesion, midline shift or mass effect. No hydrocephalus or extra-axial fluid collection. Pituitary gland felt to be within normal limits. Suprasellar region normal. No abnormal enhancement. Vascular: Major intracranial vascular flow voids are maintained. Skull and upper cervical spine: Craniocervical junction within normal limits. Bone marrow signal intensity normal. No scalp soft tissue abnormality. Other: Trace right mastoid effusion noted, of doubtful significance. MRI ORBITS FINDINGS Orbits: Globes are symmetric in size with normal appearance and morphology. There is enhancement and irregularity about the left optic nerve sheath extending towards the orbital apex, consistent with acute left optic Peri neuritis (series 15, image 10). Hazy stranding within the adjacent intraconal fat. Relative sparing of the optic nerve itself, although this may be partially involved as well. Extra-ocular muscles symmetric and within normal limits. Lacrimal glands normal. No other abnormality about the orbital apices or cavernous sinus. Visualized sinuses: Mild scattered mucosal thickening present about the  ethmoidal air cells and maxillary sinuses. Paranasal sinuses are otherwise clear. Soft tissues: Periorbital soft tissues demonstrate no acute finding. IMPRESSION: MRI ORBITS IMPRESSION: 1. 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 2. Otherwise normal MRI of the orbits. MRI HEAD IMPRESSION: 1. 6-7 mm focus of diffusion signal  abnormality involving the deep white matter of the high posterior right frontal centrum semi ovale. Finding is nonspecific, and could reflect a small subacute small vessel infarct. An underlying vasculopathy could be considered given the concomitant findings of left optic nerve perineuritis. A demyelinating lesion would be the primary differential consideration. 2. A few additional subcentimeter foci of FLAIR hyperintensity involving the supratentorial cerebral white matter, nonspecific. Electronically Signed   By: Rise Mu M.D.   On: 05/23/2023 00:24   DG CHEST PORT 1 VIEW  Result Date: 05/22/2023 CLINICAL DATA:  Shortness of breath EXAM: PORTABLE CHEST 1 VIEW COMPARISON:  None Available. FINDINGS: The heart size and mediastinal contours are within normal limits. Both lungs are clear. The visualized skeletal structures are unremarkable. IMPRESSION: No active disease. Electronically Signed   By: Jasmine Pang M.D.   On: 05/22/2023 23:25   CT VENOGRAM HEAD  Result Date: 05/22/2023 CLINICAL DATA:  AVM/AVF, high-flow vascular malformation; Dural venous sinus thrombosis suspected. Headache. Vision changes. EXAM: CT ANGIOGRAPHY HEAD AND NECK WITH AND WITHOUT CONTRAST CT VENOGRAM HEAD TECHNIQUE: Multidetector CT imaging of the head and neck was performed using the standard protocol during bolus administration of intravenous contrast. Multiplanar CT image reconstructions and MIPs were obtained to evaluate the vascular anatomy. Carotid stenosis measurements (when applicable) are obtained utilizing NASCET criteria, using the distal internal carotid diameter as the  denominator. Venographic phase images of the brain were obtained following the administration of intravenous contrast. Multiplanar reformats and maximum intensity projections were generated. RADIATION DOSE REDUCTION: This exam was performed according to the departmental dose-optimization program which includes automated exposure control, adjustment of the mA and/or kV according to patient size and/or use of iterative reconstruction technique. CONTRAST:  75mL OMNIPAQUE IOHEXOL 350 MG/ML SOLN COMPARISON:  Same day head CT. FINDINGS: CTA NECK FINDINGS Aortic arch: Great vessel origins are patent without significant stenosis. Right carotid system: No evidence of dissection, stenosis (50% or greater), or occlusion. Left carotid system: No evidence of dissection, stenosis (50% or greater), or occlusion. Vertebral arteries: Codominant. No evidence of dissection, stenosis (50% or greater), or occlusion. Skeleton: No evidence of acute abnormality on limited assessment. Other neck: Subcentimeter thyroid nodules which do not require further imaging follow-up (Ref: J Am Coll Radiol. 2015 Feb;12(2): 143-50). Upper chest: Visualized lung apices are clear. Review of the MIP images confirms the above findings CTA HEAD FINDINGS Anterior circulation: No significant stenosis, proximal occlusion, aneurysm, or vascular malformation. Posterior circulation: No significant stenosis, proximal occlusion, aneurysm, or vascular malformation. Left fetal type PCA, anatomic variant. Venous sinuses: See below. Anatomic variants: See above. Review of the MIP images confirms the above findings CTV HEAD FINDINGS Please note that due to timing the dural venous sinuses are actually better visualized/assessed on the CTA portion of the exam. No evidence of dural venous sinus thrombosis. The superior sagittal, transverse, sigmoid, and straight sinuses are patent. Visualized deep cerebral veins are patent. The left transverse sinus is narrowed/hypoplastic,  likely in part congenital. IMPRESSION: 1. No large vessel arterial occlusion or proximal hemodynamically significant stenosis. 2. No evidence of arteriovenous malformation or aneurysm. 3. No evidence of dural venous sinus thrombosis. 4. Partially empty sella and nondominant and narrowed left transverse sinus, which may represent normal anatomic variation but can be seen with idiopathic intracranial hypertension in the correct clinical setting. Electronically Signed   By: Feliberto Harts M.D.   On: 05/22/2023 17:52   CT ANGIO HEAD NECK W WO CM  Result Date: 05/22/2023 CLINICAL DATA:  AVM/AVF,  high-flow vascular malformation; Dural venous sinus thrombosis suspected. Headache. Vision changes. EXAM: CT ANGIOGRAPHY HEAD AND NECK WITH AND WITHOUT CONTRAST CT VENOGRAM HEAD TECHNIQUE: Multidetector CT imaging of the head and neck was performed using the standard protocol during bolus administration of intravenous contrast. Multiplanar CT image reconstructions and MIPs were obtained to evaluate the vascular anatomy. Carotid stenosis measurements (when applicable) are obtained utilizing NASCET criteria, using the distal internal carotid diameter as the denominator. Venographic phase images of the brain were obtained following the administration of intravenous contrast. Multiplanar reformats and maximum intensity projections were generated. RADIATION DOSE REDUCTION: This exam was performed according to the departmental dose-optimization program which includes automated exposure control, adjustment of the mA and/or kV according to patient size and/or use of iterative reconstruction technique. CONTRAST:  75mL OMNIPAQUE IOHEXOL 350 MG/ML SOLN COMPARISON:  Same day head CT. FINDINGS: CTA NECK FINDINGS Aortic arch: Great vessel origins are patent without significant stenosis. Right carotid system: No evidence of dissection, stenosis (50% or greater), or occlusion. Left carotid system: No evidence of dissection, stenosis (50%  or greater), or occlusion. Vertebral arteries: Codominant. No evidence of dissection, stenosis (50% or greater), or occlusion. Skeleton: No evidence of acute abnormality on limited assessment. Other neck: Subcentimeter thyroid nodules which do not require further imaging follow-up (Ref: J Am Coll Radiol. 2015 Feb;12(2): 143-50). Upper chest: Visualized lung apices are clear. Review of the MIP images confirms the above findings CTA HEAD FINDINGS Anterior circulation: No significant stenosis, proximal occlusion, aneurysm, or vascular malformation. Posterior circulation: No significant stenosis, proximal occlusion, aneurysm, or vascular malformation. Left fetal type PCA, anatomic variant. Venous sinuses: See below. Anatomic variants: See above. Review of the MIP images confirms the above findings CTV HEAD FINDINGS Please note that due to timing the dural venous sinuses are actually better visualized/assessed on the CTA portion of the exam. No evidence of dural venous sinus thrombosis. The superior sagittal, transverse, sigmoid, and straight sinuses are patent. Visualized deep cerebral veins are patent. 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 IMPRESSION: 1. No large vessel arterial occlusion or proximal hemodynamically significant stenosis. 2. No evidence of arteriovenous malformation or aneurysm. 3. No evidence of dural venous sinus thrombosis. 4. Partially empty sella and nondominant and narrowed left transverse sinus, which may represent normal anatomic variation but can be seen with idiopathic intracranial hypertension in the correct clinical setting. Electronically Signed   By: Feliberto Harts M.D.   On: 05/22/2023 17:52   CT Head Wo Contrast  Result Date: 05/22/2023 CLINICAL DATA:  Neuro deficit, acute, stroke suspected.  Headache EXAM: CT HEAD WITHOUT CONTRAST TECHNIQUE: Contiguous axial images were obtained from the base of the skull through the vertex without  intravenous contrast. RADIATION DOSE REDUCTION: This exam was performed according to the departmental dose-optimization program which includes automated exposure control, adjustment of the mA and/or kV according to patient size and/or use of iterative reconstruction technique. COMPARISON:  None Available. FINDINGS: Brain: No evidence of acute infarction, hemorrhage, hydrocephalus, extra-axial collection or mass lesion/mass effect. Vascular: No hyperdense vessel or unexpected calcification. Skull: Normal. Negative for fracture or focal lesion. Sinuses/Orbits: No acute finding. Other: None. IMPRESSION: No acute intracranial findings. Electronically Signed   By: Duanne Guess D.O.   On: 05/22/2023 15:15   CT Orbits W Contrast  Result Date: 05/22/2023 CLINICAL DATA:  Headache, double vision, photosensitivity. EXAM: CT ORBITS WITH CONTRAST TECHNIQUE: Multidetector CT images was performed according to the standard protocol following intravenous contrast administration. RADIATION DOSE REDUCTION:  This exam was performed according to the departmental dose-optimization program which includes automated exposure control, adjustment of the mA and/or kV according to patient size and/or use of iterative reconstruction technique. CONTRAST:  75mL OMNIPAQUE IOHEXOL 300 MG/ML  SOLN COMPARISON:  None Available. FINDINGS: Orbits: Right: The globe is normal. The extraocular muscles are normal. The orbital fat is normal. The optic nerve is normal. The lacrimal gland is normal. Left: The globe is normal. The extraocular muscles are normal. The orbital fat is normal. The optic nerve is normal. The lacrimal gland is normal. Visible paranasal sinuses: There is mild mucosal thickening in the paranasal sinuses. Soft tissues: Unremarkable. Osseous: There is no acute osseous abnormality or suspicious osseous lesion. Limited intracranial: Assessed on the separately dictated CT head. IMPRESSION: Normal CT appearance of the globes and orbits  with no acute finding. Electronically Signed   By: Lesia Hausen M.D.   On: 05/22/2023 15:15     Time coordinating discharge: Over 30 minutes    Lewie Chamber, MD  [facility] 05/25/2023, 3:52 PM

## 2023-05-25 NOTE — Consult Note (Signed)
[facility]  Total days of antibiotics 1       Reason for Consult:uveitiis    Referring Physician: girguis  Principal Problem:   Headache Active Problems:   Optic perineuritis    HPI: [name] is a 37 y.o. female with no significant past medical history who started to have quick onset left sided headache, eye pain with movement, and blurry vision on 9/21. Interestingly, 3 wks ago, she was treated for left sided sinus infection with azithromycin course. She denies fever, chills, no recent illness other than sinus infection 3 wks ago. No diarrheal illnesses. 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 On 9/24, she was referred to ED since she still had ongoing vertical diplopia/double vision with some left sided facial swelling. 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 She was also evaluated by neurology who recommended LP with meningitis panel, auto-immune work up. And recommend to start on steroids. 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 Slight elevated IOP but health optic nerve. The diplopia was thought to be related to perineuritis/ortibal inflammation. He recommend to follow up with him in 1 week as well as to see neuro-opth. For follow up on slow steroid taper and monitoring of intra-ocular pressures. Since starting steroids yesterday, she has already noticed improvement in her vision/less diplopia, as well as her unilateral headache resolved, and improvement in swelling about her eye. DR stack from neuro has made outpatient referral to sidney gospe from due neuro-optho for follow up. From infectious work-up, csf culture is negative, meningitis PCR panel is negative. Immunology work up for ana, ace, igG, ENBA are negative. Sed rate of 27. cRP  negative.  Interestingly, her dog was also treated for steroid responsive meningitis roughly 3 weeks ago  Past Medical History:  Diagnosis Date   Asthma    as a child; no longer needs an inhaler   Headache 04/04/2019   migraines once a month with this pregnancy only   PONV (postoperative nausea and vomiting)    vomited with last c section    Allergies: No Known Allergies  Current antibiotics:   MEDICATIONS:  heparin  5,000 Units Subcutaneous Q8H   pantoprazole  40 mg Oral Daily   prednisoLONE acetate  1 drop Left Eye Q3H while awake    Social History   Tobacco Use   Smoking status: Never   Smokeless tobacco: Never  Vaping Use   Vaping status: Never Used  Substance Use Topics   Alcohol use: No   Drug use: No    Family History  Problem Relation Age of Onset   Cancer Mother        gallbladder   Cancer Maternal Aunt        breast   Thyroid disease Maternal Aunt    Thyroid disease Paternal Aunt     Review of Systems -  12 point ros is negative except what is mentioned above  OBJECTIVE: Temp:  [97.3 F (36.3 C)-98.3 F (36.8 C)] 98.2 F (36.8 C) (09/27 0737) Pulse Rate:  [90-101] 90 (09/27 0737) Resp:  [20] 20 (09/27 0432) BP: (117-146)/(67-80) 130/76 (09/27 0737) SpO2:  [94 %-97 %] 96 % (09/27 0737) Physical Exam  Constitutional:  oriented to person, place, and time. appears well-developed and well-nourished. No distress.  HENT:  [place]/AT, PERRLA, no scleral icterus. Slight swelling to upper and lower eyelid of left eye Mouth/Throat: Oropharynx is clear and moist. No oropharyngeal exudate.  Neck = supple, no nuchal rigidity Neurological: alert and oriented to person, place, and time. FROM ,maybe 1 beat of nystagmus with lateral gaze with left eye. Skin: Skin is warm and dry. No rash noted. No erythema.  Psychiatric: a normal mood and affect.  behavior is normal.    LABS: Results for orders placed or performed during the hospital encounter of 05/22/23 (from  the past 48 hour(s))  Meningitis/Encephalitis Panel (CSF)     Status: None   Collection Time: 05/23/23  2:17 PM  Result Value Ref Range   Cryptococcus neoformans/gattii (CSF) NOT DETECTED NOT DETECTED    Comment: (NOTE) Patients with a suspicion of cryptococcal meningitis should be tested  for cryptococcal antigen (CrAg).      Cytomegalovirus (CSF) NOT DETECTED NOT DETECTED   Enterovirus (CSF) NOT DETECTED NOT DETECTED   Escherichia coli K1 (CSF) NOT DETECTED NOT DETECTED    Comment: (NOTE) Only E. coli strains possessing the K1 capsular antigen will be detected.      Haemophilus influenzae (CSF) NOT DETECTED NOT DETECTED   Herpes simplex virus 1 (CSF) NOT DETECTED NOT DETECTED   Herpes simplex virus 2 (CSF) NOT DETECTED NOT DETECTED   Human herpesvirus 6 (CSF) NOT DETECTED NOT DETECTED   Human parechovirus (CSF) NOT DETECTED NOT DETECTED   Listeria monocytogenes (CSF) NOT DETECTED NOT DETECTED   Neisseria meningitis (CSF) NOT DETECTED NOT DETECTED    Comment: (NOTE) Only encapsulated strains of N. meningitidis will be detected.     Streptococcus agalactiae (CSF) NOT DETECTED NOT DETECTED   Streptococcus pneumoniae (CSF) NOT DETECTED NOT DETECTED   Varicella zoster virus (CSF) NOT DETECTED NOT DETECTED    Comment: Performed at [facility], [address] [place], [place] [address]  VDRL, CSF     Status: None   Collection Time: 05/23/23  2:17 PM  Result Value Ref Range   VDRL Quant, CSF Non Reactive Non Rea:<1:1    Comment: (NOTE) Performed At: [facility] [address] [place], [place] [id number] Jolene Schimke MD XB:[id number]   Cytology - Non PAP;     Status: None   Collection Time: 05/23/23  2:17 PM  Result Value Ref Range   CYTOLOGY - NON GYN      CYTOLOGY - NON PAP CASE: [id number] PATIENT: [name] Non-Gynecological Cytology Report     Clinical History: None provided Specimen Submitted:  A. CEREBRAL SPINAL FLUID, SPINAL  TAP:   FINAL MICROSCOPIC DIAGNOSIS: - No malignant cells identified  SPECIMEN ADEQUACY: Satisfactory for evaluation  GROSS: Received is/are: 2cc's of clear, colorless fluid. (MW:mw) Smears: 0 Concentration Method (Thin Prep): 1 Cell Block: 0 Additional Studies: Also received are 2 hematology slides labeled [id number].     Final Diagnosis performed by Holley Bouche, MD.   Electronically signed 05/24/2023 Technical component performed at [facility]. [facility], [address] [place], [place] [address].  Professional component performed at [facility], [address], [place], [place] [address].  Immunohistochemistry Technical component (if applicable) was performed at [facility]. [address], [address], [place],  [place] [address].   IMMUNOHISTOCHEMISTRY DISCLAIMER (if applicable): Some of these immunohistochemical stains may have been developed and the performance characteristics determine by [facility]. Some may not have been cleared or approved by the U.S. Food and Drug Administration. The FDA  has determined that such clearance or approval is not necessary. This test is used for clinical purposes. It should not be regarded as investigational or for research. This laboratory is certified under the Clinical Laboratory Improvement Amendments of 1988 (CLIA-88) as qualified to perform high complexity clinical laboratory testing.  The controls stained appropriately.   IHC stains are performed on formalin fixed, paraffin embedded tissue using a 3,3"diaminobenzidine (DAB) chromogen and Leica Bond Autostainer System. The staining intensity of the nucleus is score manually and is reported as the percentage of tumor cell nuclei demonstrating specific nuclear staining. The specimens a re fixed in 10% Neutral Formalin for at least 6 hours and up to 72hrs. These tests are validated on decalcified tissue. Results should be  interpreted with caution given the possibility of false negative results on decalcified specimens. Antibody Clones are as follows ER-clone 3F, PR-clone 16, Ki67- clone MM1. Some of these immunohistochemical stains may have been developed and the performance characteristics determined by [facility].   CSF cell count with differential     Status: Abnormal   Collection Time: 05/23/23  2:17 PM  Result Value Ref Range   Tube # 1    Color, CSF PINK (A) COLORLESS   Appearance, CSF CLOUDY (A) CLEAR   Supernatant COLORLESS    RBC Count, CSF 3,750 (H) 0 /cu mm   WBC, CSF 20 (HH) 0 - 5 /cu mm    Comment: CRITICAL RESULT CALLED TO, READ BACK BY AND VERIFIED WITH: DEL GAIZO,ALLEN RN @1522  05/23/23 SATRAINR    Segmented Neutrophils-CSF 69 (H) 0 - 6 %   Lymphs, CSF 15 (L) 40 - 80 %   Monocyte-Macrophage-Spinal Fluid 16 15 - 45 %   Eosinophils, CSF 0 0 - 1 %    Comment: Performed at [facility], [address], [place], [place] [address]  Protein and glucose, CSF     Status: Abnormal   Collection Time: 05/23/23  2:17 PM  Result Value Ref Range   Glucose, CSF 43 40 - 70 mg/dL   Total  Protein, CSF 86 (H) 15 - 45 mg/dL    Comment: Performed at [facility], [address] [place], [place] [address]  RPR     Status: None   Collection Time: 05/24/23  5:21 AM  Result Value Ref Range   RPR Ser Ql NON REACTIVE NON REACTIVE    Comment: Performed at [facility], [address] [place], [place] [address]  Basic metabolic panel     Status: Abnormal   Collection Time: 05/24/23  5:21 AM  Result Value Ref Range   Sodium 138 135 - 145 mmol/L   Potassium 3.6 3.5 - 5.1 mmol/L   Chloride 106 98 - 111 mmol/L   CO2 23 22 - 32 mmol/L   Glucose, Bld 100 (H) 70 - 99 mg/dL    Comment: Glucose reference range applies only to samples taken after fasting for at least 8 hours.   BUN 6 6 - 20 mg/dL   Creatinine, Ser 1.88 0.44 - 1.00 mg/dL   Calcium 8.9 8.9 - 41.6 mg/dL   GFR,  Estimated >60 >63 mL/min    Comment: (NOTE) Calculated using the CKD-EPI Creatinine Equation (2021)    Anion gap 9 5 - 15    Comment: Performed at [facility], [address] [place], [place] [address]  CBC with Differential/Platelet     Status: None   Collection Time: 05/24/23  5:21 AM  Result Value Ref Range   WBC 7.8 4.0 - 10.5 K/uL   RBC 4.54 3.87 - 5.11 MIL/uL   Hemoglobin 12.8 12.0 - 15.0 g/dL   HCT 42.5 95.6 - 38.7 %   MCV 83.0 80.0 - 100.0 fL   MCH 28.2 26.0 - 34.0 pg   MCHC 34.0 30.0 - 36.0 g/dL   RDW 56.4 33.2 - 95.1 %   Platelets 341 150 - 400 K/uL   nRBC 0.0 0.0 - 0.2 %   Neutrophils Relative % 68 %   Neutro Abs 5.3 1.7 - 7.7 K/uL   Lymphocytes Relative 24 %   Lymphs Abs 1.8 0.7 - 4.0 K/uL   Monocytes Relative 5 %   Monocytes Absolute 0.4 0.1 - 1.0 K/uL   Eosinophils Relative 2 %   Eosinophils Absolute 0.1 0.0 - 0.5 K/uL   Basophils Relative 1 %   Basophils Absolute 0.1 0.0 - 0.1 K/uL   Immature Granulocytes 0 %   Abs Immature Granulocytes 0.03 0.00 - 0.07 K/uL    Comment: Performed at [facility], [address] [place], [place] [address]  Magnesium     Status: None   Collection Time: 05/24/23  5:21 AM  Result Value Ref Range   Magnesium 2.0 1.7 - 2.4 mg/dL    Comment: Performed at [facility], [address], [place], [place] [address]    IMAGING: DG [place] GUIDED LUMBAR PUNCTURE  Result Date: 05/23/2023 CLINICAL DATA:  37 year old female with headache and diplopia. Request for lumbar puncture EXAM: LUMBAR PUNCTURE UNDER FLUOROSCOPY PROCEDURE: An appropriate skin entry site was determined fluoroscopically. Operator donned sterile gloves and mask. Skin site was marked, then prepped with Betadine, draped in usual sterile fashion, and infiltrated locally with 1% lidocaine. A 20 gauge spinal needle advanced into the thecal sac at L4-L5 from a right interlaminar approach. Clear colorless CSF spontaneously returned, with opening pressure of 21 cm  water. 16 ml CSF were collected and divided among 4 sterile vials for the requested laboratory studies. The needle was then removed. The patient tolerated the procedure well and there were no complications. FLUOROSCOPY: Radiation Exposure Index (as provided by the fluoroscopic device): 3.2 mGy Kerma IMPRESSION: Technically successful lumbar puncture under fluoroscopy. This exam was performed by Loyce Dys, PA, and was supervised and interpreted by Malachy Moan, MD. Electronically Signed   By: Malachy Moan M.D.   On: 05/23/2023 17:24     Assessment/Plan:  36yo F with new onset, left eye uveitis with optic perineuitis improving on high dose steroids. - Infectious causes of optic neuritis have been ruled out -she is negative for hiv, syphilis, toxo, cmv, HSV1-2, VZV, HHV-6, and CMV. But suspect this idiopathic, for completeness, we will add. cryptococcal antigen, add mTB PCR to CSF - quantiferon  - though this is lower on differential and would have other presentation I believe  - will recommend to continue with steroid taper per ophtho and neurology - if further tests become positive, we will reach out to patient - recommend to have her follow up with neuro optho and dr. Sherrine Maples next week tofollow up on response to steroids and IOP-since they had been elevated on his exam.  Aram Beecham B. Drue Second MD MPH [facility] [phone]

## 2023-05-25 NOTE — Plan of Care (Signed)

## 2023-05-26 LAB — CSF CULTURE W GRAM STAIN: Culture: NO GROWTH

## 2023-05-27 LAB — PROTEIN ELECTROPHORESIS, SERUM
A/G Ratio: 1.3 (ref 0.7–1.7)
Albumin ELP: 3.9 g/dL (ref 2.9–4.4)
Alpha-1-Globulin: 0.2 g/dL (ref 0.0–0.4)
Alpha-2-Globulin: 0.8 g/dL (ref 0.4–1.0)
Beta Globulin: 1 g/dL (ref 0.7–1.3)
Gamma Globulin: 1.2 g/dL (ref 0.4–1.8)
Globulin, Total: 3.1 g/dL (ref 2.2–3.9)
Total Protein ELP: 7 g/dL (ref 6.0–8.5)

## 2023-05-28 LAB — OLIGOCLONAL BANDS, CSF + SERM

## 2023-05-29 LAB — QUANTIFERON-TB GOLD PLUS (RQFGPL)
QuantiFERON Mitogen Value: 0.04 [IU]/mL
QuantiFERON Nil Value: 0 [IU]/mL
QuantiFERON TB1 Ag Value: 0 [IU]/mL
QuantiFERON TB2 Ag Value: 0 [IU]/mL

## 2023-05-29 LAB — CSF CULTURE W GRAM STAIN

## 2023-05-29 LAB — ANGIOTENSIN CONVERTING ENZYME, CSF: Angio Convert Enzyme: <1.5 U/L (ref 0.0–2.5)

## 2023-05-29 LAB — QUANTIFERON-TB GOLD PLUS: QuantiFERON-TB Gold Plus: UNDETERMINED — AB

## 2023-05-30 DIAGNOSIS — H4612 Retrobulbar neuritis, left eye: Secondary | ICD-10-CM | POA: Diagnosis not present

## 2023-05-30 DIAGNOSIS — H20012 Primary iridocyclitis, left eye: Secondary | ICD-10-CM | POA: Diagnosis not present

## 2023-05-30 LAB — MISC LABCORP TEST (SEND OUT)

## 2023-06-14 DIAGNOSIS — H469 Unspecified optic neuritis: Secondary | ICD-10-CM | POA: Diagnosis not present

## 2023-07-12 LAB — MTB-RIF NAA W AFB CULT, NON-SPUTUM
Acid Fast Culture: NEGATIVE
M tuberculosis complex: NOT DETECTED

## 2023-07-30 DIAGNOSIS — Z111 Encounter for screening for respiratory tuberculosis: Secondary | ICD-10-CM | POA: Diagnosis not present

## 2023-07-30 DIAGNOSIS — H469 Unspecified optic neuritis: Secondary | ICD-10-CM | POA: Diagnosis not present

## 2023-09-24 DIAGNOSIS — H469 Unspecified optic neuritis: Secondary | ICD-10-CM | POA: Diagnosis not present

## 2023-09-24 DIAGNOSIS — H57052 Tonic pupil, left eye: Secondary | ICD-10-CM | POA: Diagnosis not present

## 2023-09-24 DIAGNOSIS — R9082 White matter disease, unspecified: Secondary | ICD-10-CM | POA: Diagnosis not present

## 2023-09-26 DIAGNOSIS — Z01419 Encounter for gynecological examination (general) (routine) without abnormal findings: Secondary | ICD-10-CM | POA: Diagnosis not present

## 2023-09-26 DIAGNOSIS — Z6841 Body Mass Index (BMI) 40.0 and over, adult: Secondary | ICD-10-CM | POA: Diagnosis not present

## 2023-10-12 IMAGING — CR DG WRIST COMPLETE 3+V*R*
3 series · 3 of 3 positions shown · non-contrast
Comparison: None.

CLINICAL DATA: Right wrist fracture.

EXAM:
RIGHT WRIST - COMPLETE 3+ VIEW

[wrist pa]
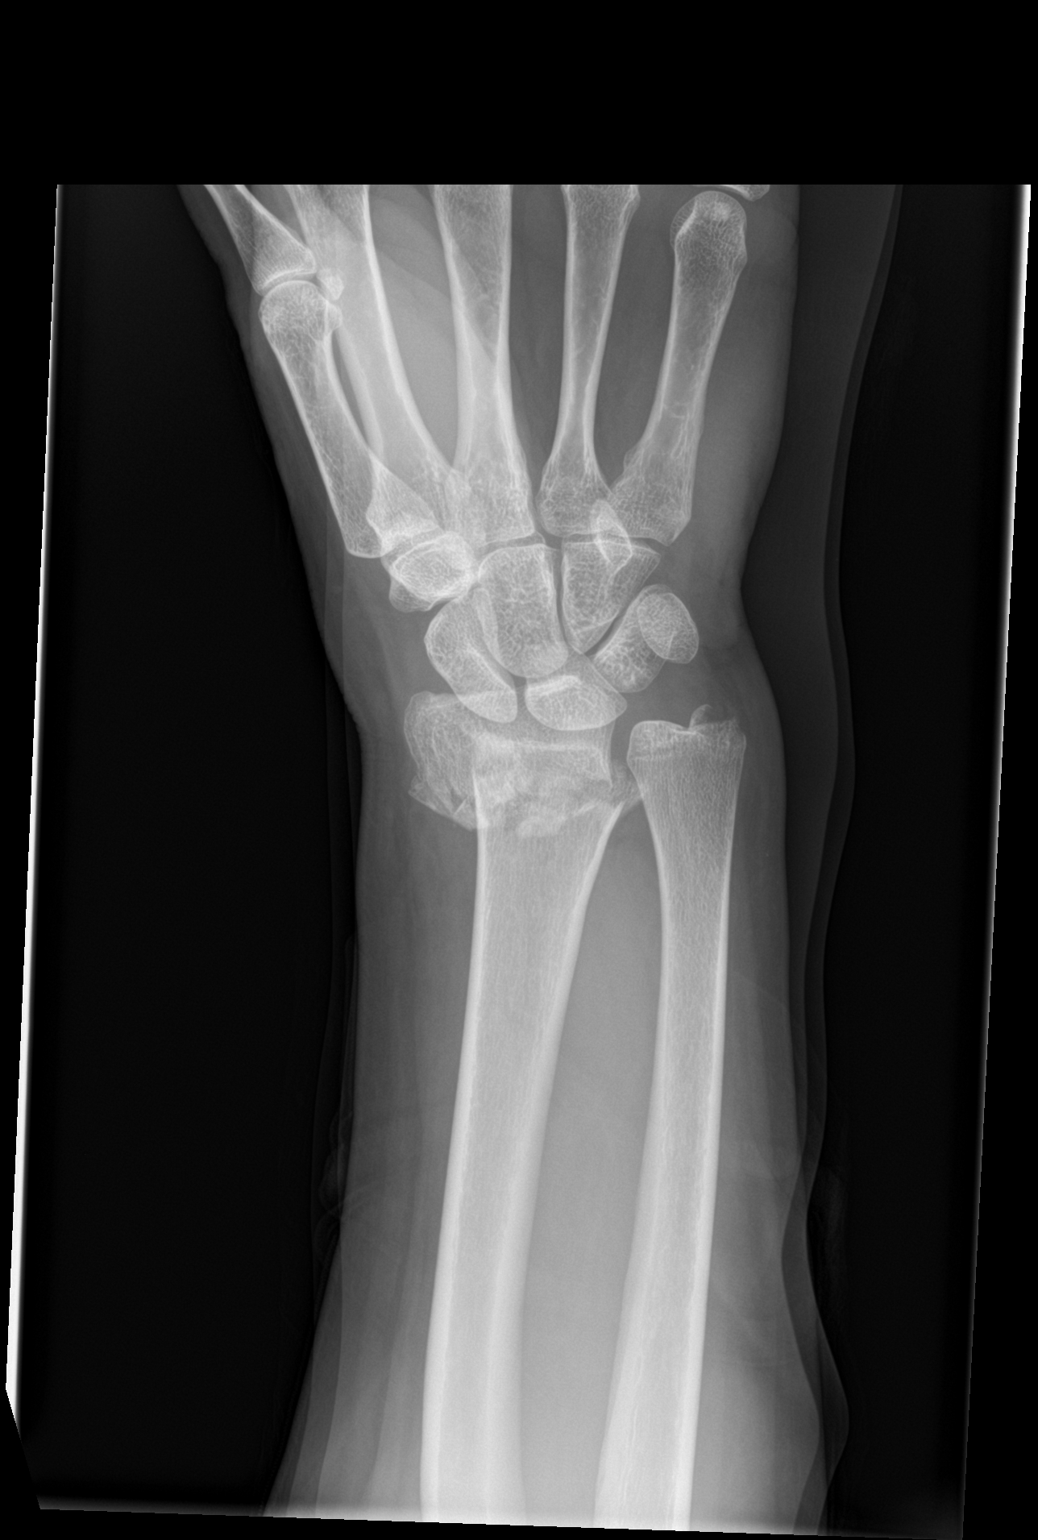

[wrist obl]
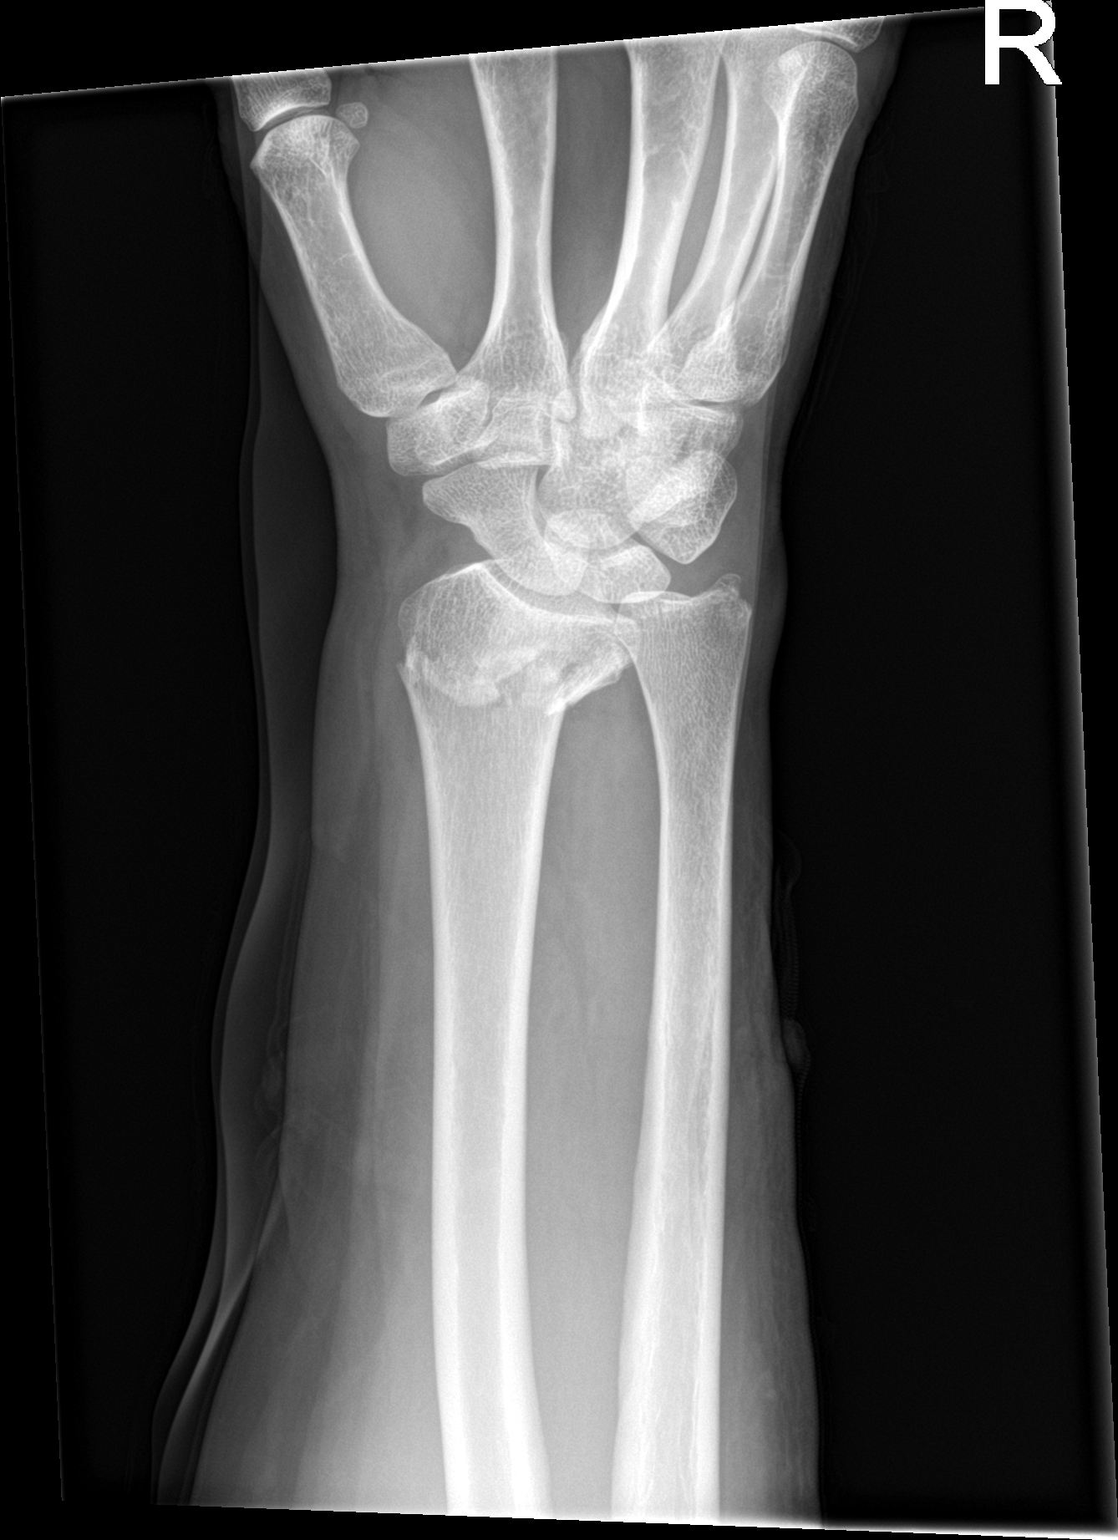

[wrist lat]
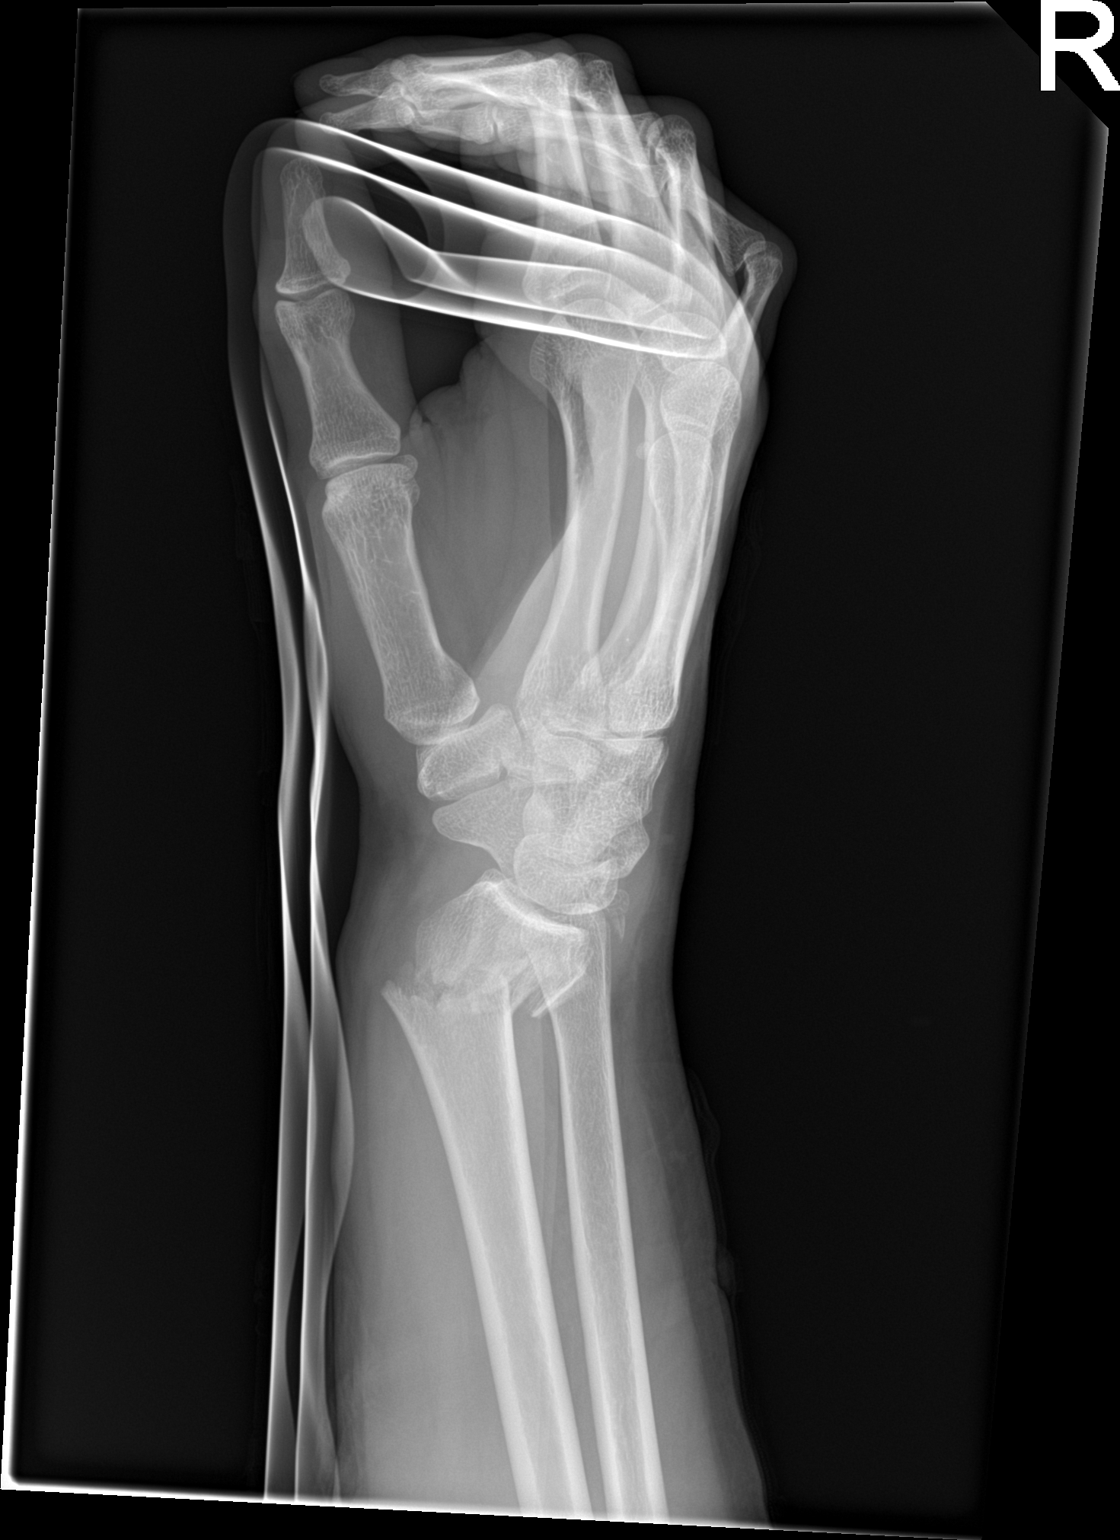

[3 of 3 positions shown; findings below may reference images not displayed]

FINDINGS: There is a comminuted fracture of the distal radial metaphysis with
dorsal angulation. An ulnar styloid fracture is also noted. Soft
tissue swelling is present about the wrist.
IMPRESSION: 1. Comminuted fracture of the distal radius with dorsal angulation.
2. Ulnar styloid fracture.

## 2023-11-20 DIAGNOSIS — H469 Unspecified optic neuritis: Secondary | ICD-10-CM | POA: Diagnosis not present

## 2023-11-20 DIAGNOSIS — H57052 Tonic pupil, left eye: Secondary | ICD-10-CM | POA: Diagnosis not present

## 2024-02-21 DIAGNOSIS — D2272 Melanocytic nevi of left lower limb, including hip: Secondary | ICD-10-CM | POA: Diagnosis not present

## 2024-02-21 DIAGNOSIS — D485 Neoplasm of uncertain behavior of skin: Secondary | ICD-10-CM | POA: Diagnosis not present

## 2024-02-21 DIAGNOSIS — D2262 Melanocytic nevi of left upper limb, including shoulder: Secondary | ICD-10-CM | POA: Diagnosis not present

## 2024-02-21 DIAGNOSIS — D2239 Melanocytic nevi of other parts of face: Secondary | ICD-10-CM | POA: Diagnosis not present

## 2024-02-21 DIAGNOSIS — D2261 Melanocytic nevi of right upper limb, including shoulder: Secondary | ICD-10-CM | POA: Diagnosis not present

## 2024-02-21 DIAGNOSIS — D2271 Melanocytic nevi of right lower limb, including hip: Secondary | ICD-10-CM | POA: Diagnosis not present

## 2024-02-21 DIAGNOSIS — D225 Melanocytic nevi of trunk: Secondary | ICD-10-CM | POA: Diagnosis not present

## 2024-02-21 DIAGNOSIS — D220 Melanocytic nevi of lip: Secondary | ICD-10-CM | POA: Diagnosis not present

## 2024-02-21 DIAGNOSIS — L814 Other melanin hyperpigmentation: Secondary | ICD-10-CM | POA: Diagnosis not present

## 2024-02-21 DIAGNOSIS — D224 Melanocytic nevi of scalp and neck: Secondary | ICD-10-CM | POA: Diagnosis not present

## 2024-04-25 ENCOUNTER — Ambulatory Visit: Admission: EM | Admit: 2024-04-25 | Discharge: 2024-04-25 | Disposition: A | Discharge status: Home / Self Care

## 2024-04-25 ENCOUNTER — Encounter: Payer: Self-pay | Admitting: Emergency Medicine

## 2024-04-25 DIAGNOSIS — S20211A Contusion of right front wall of thorax, initial encounter: Secondary | ICD-10-CM | POA: Diagnosis not present

## 2024-04-25 DIAGNOSIS — S8012XA Contusion of left lower leg, initial encounter: Secondary | ICD-10-CM | POA: Diagnosis not present

## 2024-04-25 DIAGNOSIS — M542 Cervicalgia: Secondary | ICD-10-CM

## 2024-04-25 DIAGNOSIS — S3991XA Unspecified injury of abdomen, initial encounter: Secondary | ICD-10-CM

## 2024-04-25 DIAGNOSIS — S20212A Contusion of left front wall of thorax, initial encounter: Secondary | ICD-10-CM

## 2024-04-25 DIAGNOSIS — R0789 Other chest pain: Secondary | ICD-10-CM | POA: Diagnosis not present

## 2024-04-25 NOTE — Discharge Instructions (Addendum)
 Your pain is most likely caused by irritation to the muscles   You may continue use of ibuprofen  and/or Tylenol  as needed for pain  For bruising apply ice over the affected area and to do 15-minute intervals  You may use heating pad in 15 minute intervals as needed for additional comfort, within the first 2-3 days you may find comfort in using ice in 10-15 minutes over affected area  Begin stretching affected area daily for 10 minutes as tolerated to further loosen muscles   When lying down place pillow underneath and between knees for support  Can try sleeping without pillow on firm mattress   Practice good posture: head back, shoulders back, chest forward, pelvis back and weight distributed evenly on both legs  If pain persist after recommended treatment or reoccurs if may be beneficial to follow up with orthopedic specialist for evaluation, this doctor specializes in the bones and can manage your symptoms long-term with options such as but not limited to imaging, medications or physical therapy
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
## 2024-06-17 DIAGNOSIS — M79671 Pain in right foot: Secondary | ICD-10-CM | POA: Diagnosis not present

## 2024-07-02 DIAGNOSIS — E785 Hyperlipidemia, unspecified: Secondary | ICD-10-CM | POA: Diagnosis not present

## 2024-07-02 DIAGNOSIS — Z0000 Encounter for general adult medical examination without abnormal findings: Secondary | ICD-10-CM | POA: Diagnosis not present

## 2024-07-02 DIAGNOSIS — E559 Vitamin D deficiency, unspecified: Secondary | ICD-10-CM | POA: Diagnosis not present
